# Patient Record
Sex: Female | Born: 1945 | Race: White | Hispanic: No | Marital: Married
Health system: Southern US, Community
[De-identification: ages and names within clinical notes are randomized; demographics above are authoritative.]

---

## 1997-11-27 ENCOUNTER — Other Ambulatory Visit: Admission: RE | Admit: 1997-11-27 | Discharge: 1997-11-27 | Payer: Self-pay | Admitting: Gynecology

## 1998-09-15 ENCOUNTER — Encounter: Admission: RE | Admit: 1998-09-15 | Discharge: 1998-10-22 | Payer: Self-pay | Admitting: Neurosurgery

## 1998-09-18 ENCOUNTER — Ambulatory Visit (HOSPITAL_COMMUNITY): Admission: RE | Admit: 1998-09-18 | Discharge: 1998-09-18 | Payer: Self-pay | Admitting: Neurosurgery

## 1998-09-18 ENCOUNTER — Encounter: Payer: Self-pay | Admitting: Neurosurgery

## 1998-10-02 ENCOUNTER — Ambulatory Visit (HOSPITAL_COMMUNITY): Admission: RE | Admit: 1998-10-02 | Discharge: 1998-10-02 | Payer: Self-pay | Admitting: Neurosurgery

## 1998-10-02 ENCOUNTER — Encounter: Payer: Self-pay | Admitting: Neurosurgery

## 1998-10-15 ENCOUNTER — Encounter: Payer: Self-pay | Admitting: Neurosurgery

## 1998-10-15 ENCOUNTER — Ambulatory Visit (HOSPITAL_COMMUNITY): Admission: RE | Admit: 1998-10-15 | Discharge: 1998-10-15 | Payer: Self-pay | Admitting: Neurosurgery

## 1998-12-19 ENCOUNTER — Other Ambulatory Visit: Admission: RE | Admit: 1998-12-19 | Discharge: 1998-12-19 | Payer: Self-pay | Admitting: Gynecology

## 1999-07-31 ENCOUNTER — Ambulatory Visit (HOSPITAL_BASED_OUTPATIENT_CLINIC_OR_DEPARTMENT_OTHER): Admission: RE | Admit: 1999-07-31 | Discharge: 1999-07-31 | Payer: Self-pay | Admitting: Orthopedic Surgery

## 2000-01-08 ENCOUNTER — Other Ambulatory Visit: Admission: RE | Admit: 2000-01-08 | Discharge: 2000-01-08 | Payer: Self-pay | Admitting: Gynecology

## 2000-01-29 ENCOUNTER — Encounter: Admission: RE | Admit: 2000-01-29 | Discharge: 2000-01-29 | Payer: Self-pay | Admitting: Gynecology

## 2000-01-29 ENCOUNTER — Encounter: Payer: Self-pay | Admitting: Gynecology

## 2000-08-26 ENCOUNTER — Inpatient Hospital Stay (HOSPITAL_COMMUNITY): Admission: RE | Admit: 2000-08-26 | Discharge: 2000-08-28 | Payer: Self-pay | Admitting: Gynecology

## 2001-01-30 ENCOUNTER — Encounter: Payer: Self-pay | Admitting: Gynecology

## 2001-01-30 ENCOUNTER — Encounter: Admission: RE | Admit: 2001-01-30 | Discharge: 2001-01-30 | Payer: Self-pay | Admitting: Gynecology

## 2001-04-25 ENCOUNTER — Encounter: Payer: Self-pay | Admitting: Emergency Medicine

## 2001-04-25 ENCOUNTER — Encounter: Admission: RE | Admit: 2001-04-25 | Discharge: 2001-04-25 | Payer: Self-pay | Admitting: Emergency Medicine

## 2001-05-12 ENCOUNTER — Encounter: Admission: RE | Admit: 2001-05-12 | Discharge: 2001-05-12 | Payer: Self-pay | Admitting: Emergency Medicine

## 2001-05-12 ENCOUNTER — Encounter: Payer: Self-pay | Admitting: Emergency Medicine

## 2001-10-02 ENCOUNTER — Other Ambulatory Visit: Admission: RE | Admit: 2001-10-02 | Discharge: 2001-10-02 | Payer: Self-pay | Admitting: Gynecology

## 2002-02-02 ENCOUNTER — Encounter: Payer: Self-pay | Admitting: Gynecology

## 2002-02-02 ENCOUNTER — Encounter: Admission: RE | Admit: 2002-02-02 | Discharge: 2002-02-02 | Payer: Self-pay | Admitting: Gynecology

## 2002-10-04 ENCOUNTER — Other Ambulatory Visit: Admission: RE | Admit: 2002-10-04 | Discharge: 2002-10-04 | Payer: Self-pay | Admitting: Gynecology

## 2003-02-04 ENCOUNTER — Encounter: Payer: Self-pay | Admitting: Gynecology

## 2003-02-04 ENCOUNTER — Encounter: Admission: RE | Admit: 2003-02-04 | Discharge: 2003-02-04 | Payer: Self-pay | Admitting: Gynecology

## 2003-08-18 ENCOUNTER — Encounter: Admission: RE | Admit: 2003-08-18 | Discharge: 2003-08-18 | Payer: Self-pay | Admitting: Neurosurgery

## 2003-09-25 ENCOUNTER — Inpatient Hospital Stay (HOSPITAL_COMMUNITY): Admission: RE | Admit: 2003-09-25 | Discharge: 2003-09-27 | Payer: Self-pay | Admitting: Neurosurgery

## 2003-10-17 ENCOUNTER — Other Ambulatory Visit: Admission: RE | Admit: 2003-10-17 | Discharge: 2003-10-17 | Payer: Self-pay | Admitting: Gynecology

## 2004-02-05 ENCOUNTER — Encounter: Admission: RE | Admit: 2004-02-05 | Discharge: 2004-02-05 | Payer: Self-pay | Admitting: Gynecology

## 2004-10-20 ENCOUNTER — Other Ambulatory Visit: Admission: RE | Admit: 2004-10-20 | Discharge: 2004-10-20 | Payer: Self-pay | Admitting: Gynecology

## 2004-11-16 ENCOUNTER — Observation Stay (HOSPITAL_COMMUNITY): Admission: RE | Admit: 2004-11-16 | Discharge: 2004-11-17 | Payer: Self-pay | Admitting: Gynecology

## 2005-02-23 ENCOUNTER — Encounter: Admission: RE | Admit: 2005-02-23 | Discharge: 2005-02-23 | Payer: Self-pay | Admitting: Gynecology

## 2005-04-09 ENCOUNTER — Ambulatory Visit (HOSPITAL_COMMUNITY): Admission: RE | Admit: 2005-04-09 | Discharge: 2005-04-09 | Payer: Self-pay | Admitting: *Deleted

## 2005-04-09 ENCOUNTER — Encounter (INDEPENDENT_AMBULATORY_CARE_PROVIDER_SITE_OTHER): Payer: Self-pay | Admitting: *Deleted

## 2005-11-03 ENCOUNTER — Other Ambulatory Visit: Admission: RE | Admit: 2005-11-03 | Discharge: 2005-11-03 | Payer: Self-pay | Admitting: Gynecology

## 2006-02-07 ENCOUNTER — Encounter (INDEPENDENT_AMBULATORY_CARE_PROVIDER_SITE_OTHER): Payer: Self-pay | Admitting: Specialist

## 2006-02-07 ENCOUNTER — Inpatient Hospital Stay (HOSPITAL_COMMUNITY): Admission: RE | Admit: 2006-02-07 | Discharge: 2006-02-12 | Payer: Self-pay | Admitting: General Surgery

## 2006-03-24 ENCOUNTER — Encounter: Admission: RE | Admit: 2006-03-24 | Discharge: 2006-03-24 | Payer: Self-pay | Admitting: Gynecology

## 2006-11-17 ENCOUNTER — Other Ambulatory Visit: Admission: RE | Admit: 2006-11-17 | Discharge: 2006-11-17 | Payer: Self-pay | Admitting: Gynecology

## 2007-04-19 ENCOUNTER — Encounter: Admission: RE | Admit: 2007-04-19 | Discharge: 2007-04-19 | Payer: Self-pay | Admitting: Gynecology

## 2008-11-04 ENCOUNTER — Encounter: Admission: RE | Admit: 2008-11-04 | Discharge: 2008-11-04 | Payer: Self-pay | Admitting: Gynecology

## 2010-08-18 ENCOUNTER — Other Ambulatory Visit: Payer: Self-pay | Admitting: Internal Medicine

## 2010-08-18 DIAGNOSIS — Z1231 Encounter for screening mammogram for malignant neoplasm of breast: Secondary | ICD-10-CM

## 2010-09-04 ENCOUNTER — Ambulatory Visit
Admission: RE | Admit: 2010-09-04 | Discharge: 2010-09-04 | Disposition: A | Discharge status: Home / Self Care | Payer: Medicare Other | Source: Ambulatory Visit | Attending: Internal Medicine | Admitting: Internal Medicine

## 2010-09-04 DIAGNOSIS — Z1231 Encounter for screening mammogram for malignant neoplasm of breast: Secondary | ICD-10-CM

## 2010-10-05 ENCOUNTER — Other Ambulatory Visit: Payer: Self-pay | Admitting: Gynecology

## 2010-10-30 NOTE — Discharge Summary (Signed)
[facility]  Patient:    Madeline Contreras, Madeline Contreras                        MRN: [id number] Adm. Date:  08/26/00 Disc. Date: 08/28/00 Attending:  Gretta Cool, M.D. CC:         Juluis Mire, M.D.   Discharge Summary  HISTORY OF PRESENT ILLNESS:  Ms. Hillesheim is a 65 year old white married female, gravida 2, para 2, who is admitted for definitive therapy for severe pelvic organ prolapse with incontinence of urine.  She has basically genuine stress incontinence and denies significant urgency.  Upon examination, it was noted that she has a large grade 3 to 4 cystocele even in the supine position with a bulge of the cystocele through the introitus.  There is marked paravaginal weakness and bilateral paravaginal defects, greater on the right than the left.  She also has apical support weakness with uterine prolapse, grade 2. There is also an inner sealing rectocele.  She reports incontinence of gas and liquid stool.  On physical examination, there is evidence of neurologic injury, possibly due to obstetric in origin to the perineum with loss of perirectal reflexes and diminished anal sphincter tone.  Bladder studies were done.  Evidence of 750 cc bladder capacity.  She had 11 recorded voidings in 24 hours.  There is a 30 cc residual urine volume.  The positive Q-tip test with 90 degree rotation of the vesicle neck.  Incontinence is demonstrated in the supine position, corrected with elevation of the vesicle neck.  She is now admitted for total abdominal hysterectomy, paravaginal plus Burch repairs, posterior enterocele repairs, and uterosacral cardinal colpos suspension under general anesthesia.  PHYSICAL EXAMINATION:  CHEST:  Clear to A&P.  HEART:  Rate and rhythm are regular without murmur, gallop, or cardiac enlargement.  ABDOMEN:  Soft and scaphoid without masses or organomegaly.  PELVIC:  External genitalia within normal limits for female.  Vagina  clean and rugose.  She has uterine descensus grade 2 with cystocele created by bilateral paravaginal defects bulging through the introitus.  There is a positive Q-tip test.  She has rectocele and enterocele that is symptomatic with detachment of the peritoneal body.  Uterus is normal size, shape, and contour.  Adnexa bilaterally clear.  Rectovaginal exam confirmed.  IMPRESSION: 1. Severe pelvic organ prolapse with grade 4 cystocele, grade 2 uterine    descensus, and stress urinary incontinence. 2. History of disk disease with surgery in 1986 by Dr. Tanya Nones. [place].  PLAN:  Total abdominal hysterectomy, bilateral salpingo-oophorectomy, paravaginal plus Burch, posterior enterocele repair, uterosacral cardinal colpos suspension under general anesthesia.  Risks and benefits had been discussed with the patient and she accepts these procedures.  LABORATORY DATA:  Admission hemoglobin 14.3, hematocrit 41.7.  On the first first postoperative day, hemoglobin was 11.2, hematocrit 32.2.  EKG normal sinus rhythm with a short PR interval.  Otherwise normal EKG.  HOSPITAL COURSE:  The patient underwent the above-named procedures without complications.  The patient was returned to the recovery room in excellent condition.  Pathology report revealed unremarkable cervix with no dysplasia identified, proliferative endometrium, and endometrial polyp with simple hyperplasia without atypia.  No atypical features or carcinoma identified, adenomyosis, right and left ovaries unremarkable, and right and left fallopian tubes unremarkable.  Her postoperative course was without complications and she was discharged on the second postoperative day in excellent condition.  DISCHARGE INSTRUCTIONS:  No heavy lifting or straining.  No vaginal entry. Increase ambulation as tolerated.  She is to call for any fever over a 100.5 or failure of daily improvement.  Diet regular.  Suprapubic catheter was removed prior  to discharge.  She is to return to the office in one week for a follow-up.  DISCHARGE MEDICATIONS: 1. Tylox 1 p.o. q.4-6h. p.r.n. 2. Vioxx 1 p.o. 25 mg q.d.  CONDITION ON DISCHARGE:  Excellent.  DISCHARGE DIAGNOSES: 1. Severe pelvic organ prolapse with cystocele, rectocele, and enterocele. 2. Severe stress urinary incontinence. 3. 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  PROCEDURES: 1. Total abdominal hysterectomy. 2. Bilateral salpingo-oophorectomy. 3. Paravaginal plus Burch procedure. 4. Posterior enterocele repairs. 5. Cardinal uterosacral suspension. 6. Colpos suspension under general anesthesia. DD:  09/05/00 TD:  09/05/00 Job: [id number]

## 2010-10-30 NOTE — H&P (Signed)
Madeline Contreras, Madeline Contreras                         ACCOUNT NO.:  [id number]   MEDICAL RECORD NO.:  [id number]                   PATIENT TYPE:  INP   LOCATION:  2899                                 FACILITY:  [facility]   PHYSICIAN:  Hilda Lias, M.D.                DATE OF BIRTH:  May 16, 1946   DATE OF ADMISSION:  09/25/2003  DATE OF DISCHARGE:                                HISTORY & PHYSICAL   HISTORY:  [name] is a lady who was seen by me more than a year ago  because of neck pain. About 16 years ago, this lady underwent a  C6-7 diskectomy. When she came to see me, she had been complaining of neck  pain with some pain going into the right upper extremity.  The patient had  conservative treatment. 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   The patient came back a year later, this time in February 2005 complaining  of neck pain again with radiation to the right upper extremity, with a  tingling sensation in the right hand with episodes of having weakness of the  right hand, dropping things in it.  The pain is intense, she is quite  miserable.  She has difficulty falling asleep.  She had conservative  treatment and this time, she did not get any better. Because of that, an MRI  was obtained and in view of the findings, she wanted to proceed with  surgery.   PAST SURGICAL HISTORY:  Cervical diskectomy through a posterior approach  about 16 years ago. Also, she has history of back surgery by someone else.  Surgery on the left shoulder 2 times, as well as hysterectomy and bladder  surgery.   FAMILY HISTORY:  Unremarkable.   SOCIAL HISTORY:  She smokes, does not drink.   REVIEW OF SYSTEMS:  Positive for leg pain.   PHYSICAL EXAMINATION:  HEENT: Normal.  NECK:  She is able to flex but extension and rotation produces discomfort,  mostly to the right shoulder.  LUNGS:  Clear.  CARDIOVASCULAR:  Sounds normal.  ABDOMEN:  Normal.  EXTREMITIES:  Normal pulses.  NEUROLOGIC:  She has weakness of both wrist extensors, weakness of the right  triceps with weakness of the hypothenar muscles.  Deltoids are normal.  Reflexes show decreased triceps with absence of biceps.   STUDIES:  Cervical spine films showed cervical spondylosis at the level of  5-6, 6-7 and 7-1.  MRI showed spondylosis at the level of 6-7, 6-7 and 7-1.  Initially, the C7-1 was read as negative by the radiologist, but after we  asked Dr. Barrington Ellison to take a look, he found that indeed, patient had cervical  spondylosis at the level of cervical 7 with thoracic 1.  EMG, nerve  conduction tests showed indeed she has spondylosis which involves the 6, 7  and 8th nerve roots.   CLINICAL IMPRESSION:  Cervical spondylosis 5-6, 6-7, 7-thoracic 1.   RECOMMENDATIONS:  The patient was admitted for surgery. The procedure will  be 3 level anterior cervical diskectomy.  Surgery was explained to her and  her husband, including risks of damage to the vertebral artery with bleeding  and stroke, damage to the carotid artery, damage to the esophagus and  trachea, failure of the bone graft, need for surgery, and because we are  going to be working close to the apex of the lung, the possibility of damage  to the lung, as well as damage of the nerve going to the vocal cord.  The  patient declined another opinion.                                                Hilda Lias, M.D.    EB/MEDQ  D:  09/25/2003  T:  09/25/2003  Job:  [id number]

## 2010-10-30 NOTE — H&P (Signed)
Madeline Contreras, Madeline Contreras               ACCOUNT NO.:  [id number]   MEDICAL RECORD NO.:  [id number]          PATIENT TYPE:  INP   LOCATION:  1617                         FACILITY:  [facility]   PHYSICIAN:  Anselm Pancoast. Zachery Dakins, M.D.DATE OF BIRTH:  1946/05/14   DATE OF ADMISSION:  02/07/2006  DATE OF DISCHARGE:  02/12/2006                                HISTORY & PHYSICAL   CHIEF COMPLAINT:  Tubovillous adenoma of cecum.   HISTORY:  [name] is a 64 year old Caucasian female who I saw in  the office in July accompanied by her husband after she was referred to me  by Dr. Bosie Clos, who had done a recent repeat colonoscopy and found a  tubovillous adenoma still arising in the cecum, and she had had a previous  colonoscopy by Dr. Luther Parody approximately a year and a half earlier with a  similar lesion noted.  At that time, however, there was no high-grade  granular dysplasia or no dysplasia noted on the biopsies, and on repeat  biopsies, Dr. Bosie Clos thought that this would be best managed with a right  colectomy and referred to me for the planned surgery.  She is otherwise in  good health.  She has had disk surgery of her neck and back.  She sees Dr.  Esperanza Richters for her chronic medical care and has had a hysterectomy, also  a bladder and a rectocele repair.  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  There is no  history of colon cancer.   Last chest x-ray was done in the spring.  She has had breast mammograms.  She sees Dr. Creola Corn as her primary care, in addition to Dr. Nicholas Lose.   ALLERGIES:  DENIES ANY ALLERGIES.   MEDICATIONS:  She is on an estrogen patch, 0.025 mg, one per week and  esterase cream twice a week.   PHYSICAL EXAMINATION:  VITAL SIGNS:  She is 5 feet 2, weighs 143 pounds,  blood pressure 145/80, pulse 90.  Temperature is normal.  Well hydrated.  There is no cervical or supraclavicular lymphadenopathy.  LUNGS:  Clear.  ABDOMEN:  She has a soft flat abdomen, no organomegaly.  I did not do a  repeat rectal exam, since she days she had a recent colonoscopy.  She has  had a Erythromycin, Neomycin following a GoLYTELY bowel prep in preparation  for the right colectomy.   ADMISSION/IMPRESSION:  Persistent tubovillous adenoma of cecum.   PLAN:  A right colectomy.           ______________________________  Anselm Pancoast. Zachery Dakins, M.D.     WJW/MEDQ  D:  03/09/2006  T:  03/10/2006  Job:  [id number]

## 2010-10-30 NOTE — Op Note (Signed)
[facility]  Patient:    Madeline Contreras, Madeline Contreras                        MRN: [id number] Proc. Date: 07/31/99 Adm. Date:  [id number] Attending:  Milly Jakob CC:         Harvie Junior, M.D.             Patients chart                           Operative Report  PREOPERATIVE DIAGNOSIS:  Stiff glenohumeral joint with subacromial impingement nd partial thickness rotator cuff tear.  POSTOPERATIVE DIAGNOSIS:  Stiff glenohumeral joint with subacromial impingement and partial thickness rotator cuff tear.  OPERATIONS: 1. Manipulation of shoulder under general anesthesia. 2. Arthroscopic violation of joint with debridement of undersurface partial    thickness tear in the glenohumeral joint. 3. Anterolateral acromioplasty. 4. Distal clavicle excision.  SURGEON:  Harvie Junior, M.D.  ASSISTANT:  Kerby Less, P.A.  ANESTHESIA:  General.  INDICATION:  She is a 65 year old female with a long history of having had an injury to her shoulder at work.  She ultimately spiraled to develop a stiff shoulder.  She was in therapy for a prolonged period of time and regained some f her function but not enough to be satisfactory.  Because of continued complaints of pain and limited mobility, the patient was taken to the operating room for evaluation under anesthesia arthroscopy.  DESCRIPTION OF PROCEDURE:  The patient is taken to the operating room, and after adequate anesthesia was obtained with general anesthetic, the patient was placed supine on the operating table.  The left arm was then evaluated under anesthesia and noted to have a limitation of 120 degrees of forward flexion, internal rotation block at about L5, and an external rotation block at about the 15 degree.  Gentle manipulation was undertaken and no significant change in motion was encountered.  Slightly more forceful manipulation was able to free scar tissue o allow overhead elevation to  180 degrees, external rotation to 60 degrees with the arm at the side, 90 degrees with the arm in 90 degrees of abduction, and internal rotation to T4.  Following this, the shoulder was prepped and draped in the usual sterile fashion. Routine arthroscopic examination of the glenohumeral joint revealed there was some supraspinatus tearing.  This was debrided with a suction-shaver, certainly with a high grade partial thickness tear.  Following this, the labrum and biceps tendon were identified and noted to be normal.  No significant glenohumeral joint arthritis.  No significant articular  wear.  The camera was then removed from the glenohumeral joint and up into the subacromial space.  Significant tenacious bursa was encountered.  This was debrided with a suction-shaver.  The anterolateral corner of the acromion was then removed with a bur.  The distal clavicle was identified and then 15 mm was removed anterior to  posterior through an anterior portal.  The bursa was debrided from the superior surface and the rotator cuff was identified from superiorly and there was no full-thickness tear.  You could palpate with a probe where there was some partial tear from the undersurface but they did not appear to go full thickness in any area.  At this point, given that she had had the manipulation and the stiff shoulder as a primary process, it was felt that undertaking rotator cuff  tear was not appropriate at this time.  At this point, the shoulder was copiously irrigated and suctioned dry.  The arthroscopic portals were then closed with Steri-Strips.  A sterile compressive  dressing was applied and the patient was taken to the recovery room where she was noted to be in satisfactory condition.  ESTIMATED BLOOD LOSS:  None. DD:  07/31/99 TD:  08/01/99 Job: [id number]

## 2010-10-30 NOTE — Op Note (Signed)
Madeline Contreras, Madeline Contreras                         ACCOUNT NO.:  [id number]   MEDICAL RECORD NO.:  [id number]                   PATIENT TYPE:  INP   LOCATION:  3172                                 FACILITY:  [facility]   PHYSICIAN:  Hilda Lias, M.D.                DATE OF BIRTH:  1945/10/02   DATE OF PROCEDURE:  09/25/2003  DATE OF DISCHARGE:                                 OPERATIVE REPORT   PREOPERATIVE DIAGNOSIS:  C5-C6, C6-C7, C7-T1 herniated disc, spondylosis  with chronic radiculopathy.   POSTOPERATIVE DIAGNOSIS:  C5-C6, C6-C7, C7-T1 herniated disc, spondylosis  with chronic radiculopathy.   PROCEDURE:  Decompression of the spinal cord at C5-C6, C6-C7, and C7-T1,  with bilateral foraminotomy, interbody fusion with allograft placed from C5  to T1.  Microscope.   SURGEON:  Hilda Lias, M.D.   ASSISTANT:   CLINICAL HISTORY:  The patient was admitted because of neck pain with  radiation to the right upper extremity associated with weakness.  I have  followed this lady for many years.  In the past, she had a posterior C6-C7  discectomy.  Now, she has severe spondylosis at C5-C6, C6-C7, and C7-T1.  The patient has failed conservative treatment and surgery was advised.  The  risks were explained during the history and physical.   PROCEDURE:  The patient was taken to the OR and she was intubated.  She had  a short neck and we had to use a small roll between the shoulder blades to  extend slightly the spine.  Having done this, the area was prepped with  Betadine.  A longitudinal incision was made through the skin and platysma  down to the cervical spine.  We found a large osteophyte which we knew was  at the C5-C6 and C6-C7.  A needle was inserted and was at the C5-C6.  The  anterior ligament was opened at C5-C6 and C6-C7 and C7-T1.  Indeed, the  patient had quite a bit of degenerative disc disease between 5-6.  Using the  drill as well as the microcuret, removal of the disc was  accomplished.  The  patient has a posterior ligament which was somewhat calcified.  An incision  transversely was done with removal of osteophytes in the midline and  laterally.  Decompression was accomplished.  At the level of C7-T1, we found  the same findings with quite a bit of spondylosis, worse on the right side.  At the level of C7-T1, we found mostly herniated disc with some spondylosis  on the left side.  From then on, having good decompression of the spinal  cord, the endplates were drilled.  Three pieces of allograft was inserted.  This was followed by a plate from C5 down to T1.  Lateral C-spine showed  good position of the bone graft.  The area was irrigated.  Nevertheless,  because we had to do quite a dissection, we left  a Jackson-Pratt drain in  the space.  Having good decompression and good hemostasis, the area was  irrigated.  The wound was closed with Vicryl and Steri-Strips.                                               Hilda Lias, M.D.    EB/MEDQ  D:  09/25/2003  T:  09/25/2003  Job:  [id number]

## 2010-10-30 NOTE — Op Note (Signed)
[facility]  Patient:    Madeline Contreras, Madeline Contreras                        MRN: [id number] Proc. Date: 08/26/00 Attending:  Gretta Cool, M.D. CC:         Juluis Mire, M.D.   Operative Report  PREOPERATIVE DIAGNOSES: 1. Severe pelvic organ prolapse with cystocele, rectocele, enterocele. 2. Severe stress urinary incontinence. 3. 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  POSTOPERATIVE DIAGNOSES: 1. Severe pelvic organ prolapse with cystocele, rectocele, enterocele. 2. Severe stress urinary incontinence. 3. 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  PROCEDURES:  Total abdominal hysterectomy, bilateral salpingo-oophorectomy, paravaginal plus Burch procedure, posterior and enterocele repairs, cardinal uterosacral suspension, colposuspension.  SURGEON:  Gretta Cool, M.D.  ASSISTANT:  Raynald Kemp, M.D.  ANESTHESIA:  General endotracheal.  DESCRIPTION OF PROCEDURE:  Under excellent general anesthesia as above, with the patients abdomen prepped and draped in Allen stirrups in modified lithotomy position, with her Foley catheter draining her bladder, a Pfannenstiel incision was made and extended through the fascia. The rectus muscles were then separated in the midline and the peritoneum opened. The abdomen was then explored. There were no abnormalities identified in the upper abdomen. The appendix was sclerotic in appearance and not removed. The cecum appeared quite redundant and descended into the pelvis. The uterus, ovaries, and tubes were exceedingly mobile but otherwise without abnormality. Both ovaries were quite sclerotic and small. The adnexal structures were then elevated by placing Kelly clamps across the tubes, round ligament, and parametrial structures. The round ligament was then transected on each side using the cautery. The anterior leaf of  the broad ligament was then opened, and the bladder pushed off the lower uterine segment. The infundibulopelvic vessels were then isolated from the ureter clamped, cut, sutured, and tied with 0 Vicryl. The uterine vessels were then skeletonized, clamped, cut, sutured, and tied with 0 Vicryl. The cardinal ligaments, then uterosacral ligaments were then clamped, cut, sutured, and tied with 0 Vicryl. The cervix was then excised, and the vagina closed with a running suture of 0 Vicryl. At this point, sutures of 0 Ethibond were placed in the uterosacral ligament approximately 4 cm from its insertion to the vaginal cuff. The ligament was sutured in the cul-de-sac and then across the uterosacral insertion into the vaginal cuff posteriorly then to the cardinal ligament then to anterior vaginal wall fascia. Each suture was placed on each side and then the sutures tied. A Halban culdoplasty was then performed to close the remaining enterocele sac. The pelvis was then irrigated with lactated Ringer. There was no significant bleeding. The pelvic peritoneum was then closed with a running suture of #2-0 Monocryl from the left of the cuff to the right infundibulopelvic so as to cover the peritoneal defect. The defect was covered without tension. Pelvis was then irrigated once again, and the packs and retractors removed. The abdominal peritoneum was then closed with running suture of 0 Monocryl. At this point, attention was turned to the paravaginal and Burch retropubic suspension procedure.  The retropubic space was dissected and the adventitial structure and fatty tissue removed from the endopelvic and paracervical fascia. The landmarks were identified, including the white line fascia of the pelvis, the pubis symphysis, and the ischial spine. The defects were readily available with evidence of condensation of  fascia at the previous area of separation and cystocele formation. With the operators finger  placed in the vagina, the suture placement was guided. With sutures placed from across the separated paracervical fascia was secured from the apex of the vaginal cuff to within a centimeter of the ischial spine. Sutures were then placed at approximately 1 cm intervals until the entire paravaginal fascial defect was repaired to underneath the symphysis. The Burch procedures were then placed so as to elevate the vesicle neck into a high intra-abdominal position. At this point, all the sutures site were dry. The retropubic space was irrigated once again, and repair of the anterior abdominal wall undertaken. The rectus muscles were plicated in the midline with a running suture of 0 Monocryl. Fascia was approximated with running suture of 0 Vicryl from each angle to the midline. Subcutaneous tissues approximated with interrupted sutures of 3-0 Vicryl, and the skin closed with skin staples and Steri-Strips. At the end of the procedure, the sponge and lap counts are correct. There are no complications. The patient returned to the recovery room in excellent condition.  At this point, the patient was repositioned for the vaginal portion of the procedure. The posterior vaginal wall was grasped with the introitus by vaginal clamps, Allis clamps. The mucosa was then incised and undermined to the apex of the vaginal cuff. The mucosa was then dissected by blunt and sharp dissection from the perirectal fascia. The enterocele weakness was again identified vaginally, and the fascia secured from approximately mid vagina the fascia was pulled all the way to the vaginal cuff closure and secured to the vaginal cuff with a running suture of 0 Vicryl. The lower levels of endopelvic fascia were then plicated in the midline from the apex of the vaginal cuff to the introitus. The perineal body muscles were then reapproximated with interrupted sutures of 2-0 Vicryl. The enterocele and rectocele were found to be well  reduced by rectal exam. The mucosa was ______ and closed with a  running subcuticular closure of 2-0 Vicryl. The upper layers of endopelvic fascia were approximated along with the mucosa. At this point, the vagina remained adequate by digital. There was no evidence of weakness of fascia in any area. At this point, the bladder was filled and the Bonnano suprapubic Cystocath was placed and secured with 0 Ethibond. At the end of the procedure, sponge and lap counts were correct. No complications. The patient returned to the recovery room in excellent condition. DD:  08/26/00 TD:  08/26/00 Job: [id number]

## 2010-10-30 NOTE — Discharge Summary (Signed)
Madeline Contreras, TABOR               ACCOUNT NO.:  [id number]   MEDICAL RECORD NO.:  [id number]          PATIENT TYPE:  INP   LOCATION:  1617                         FACILITY:  [facility]   PHYSICIAN:  Anselm Pancoast. Zachery Dakins, M.D.DATE OF BIRTH:  1946/05/29   DATE OF ADMISSION:  02/07/2006  DATE OF DISCHARGE:  02/12/2006                                 DISCHARGE SUMMARY   DISCHARGING DIAGNOSES:  1. 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  2. Chronic cholecystitis with stones.   OPERATION:  Right colectomy and cholecystectomy.   HISTORY:  Ms. Kraszewski is a 65 year old female who comes in accompanied by her  husband, referred by Dr. Bosie Clos who did a recent colonoscopy on her and  found a tubovillous adenoma still in the cecum.  She had had a previous  colonoscopy and biopsy by Dr. Luther Parody approximately a year and a half  earlier and lesions were noted, but was not able to get completely excised.  Dr. Bosie Clos recommended that this be surgically removed, as there was high-  grade granular dysplasia in the present biopsies that had changed from the  previous.  The patient is otherwise in good health.  She has had orthopedic  and disk surgery.  Dr. Nicholas Lose has done a hysterectomy and she is presently on  estrogen replacement.  Chest x-ray had been unremarkable, and I recommended  that we proceed on with an elective cholecystectomy and an elective right  colectomy.  She did not give any history of symptoms consistent with  gallbladder symptoms, but at the time of surgery she was found to have  numerous stones within her gallbladder, not a lot of adhesions around it.  I  talked with the patient's husband in the waiting room and recommended we go  ahead and proceed with the cholecystectomy simultaneously.  He was in  agreement.  She did nicely.  We did not place a nasogastric tube, and it was  about 2 days before she started having any flatus.  We at that time started  on a liquid diet and  then advanced up to a full liquid and now a select  diet, and she has done nicely.   The lab studies preoperatively were unremarkable with a hematocrit of 41, a  white count of 4.1.  A postoperative hematocrit was 36.  Her liver function  studies were normal preoperatively and postoperatively.  The pathology  report showed high-grade granular dysplasia and a tubovillous adenoma, no  frank cancer, and 24 lymph nodes were excised.  The gallbladder shows  chronic cholecystitis and stones.  The patient is now about 5 days after  surgery.  Her incisions were healing nicely, and I have removed most of the  staples and place Steri-Strips, and she is having good bowel function.  She  may have a little bit of looser bowel movements than she had preoperatively,  but this should  correct itself, and she is discharged now with Vicodin for pain, and was  also continued on her estrogen replacements.  She does not work and  understands she should not be done heavy lifting  for approximately 3 weeks.  I will see her in the office for a wound check in approximately 1 week.           ______________________________  Anselm Pancoast. Zachery Dakins, M.D.     WJW/MEDQ  D:  02/12/2006  T:  02/14/2006  Job:  [id number]   cc:   Shirley Friar, MD  Fax: [phone]   Gretta Cool, M.D.  Fax: [phone]

## 2010-10-30 NOTE — H&P (Signed)
[facility]  Patient:    Madeline Contreras, Madeline Contreras                        MRN: [id number] Adm. Date:  08/26/00 Attending:  Gretta Cool, M.D. CC:         Juluis Mire, M.D.   History and Physical  CHIEF COMPLAINT: 1. Pelvic organ prolapse. 2. Urinary incontinence.  HISTORY OF PRESENT ILLNESS:  A 65 year old, white, married, gravida 2, para 2 admitted for definitive therapy of severe pelvic organ prolapse with incontinence of urine. She has a basically genuine stress incontinence pattern and denies significant urgency component. She also has a large grade 3-4 cystocele even in supine position with bulge of the cystocele through the introitus. She has marked paravaginal weakness and bilateral paravaginal defects right greater than left. She also has apical support weakness with uterine prolapse, grade 2. She also has enterocele and rectocele. 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 She also has evidence of previous anal sphincter injury at delivery with diminished neurologic reflexes perianal and diminished sphincter tone.  She has had bladder studies with evidence of 750 cc bladder capacity. She has frequent voidings with 11 recorded in 24 hours. She has 30 cc residual urine volume. She has positive Q-tip test with 90 degree rotation of the vesical neck. She has incontinence demonstrated in supine position and corrected by elevation of vesical neck. She is now admitted for a total abdominal hysterectomy, paravaginal plus burch repairs, posterior and enterocele repairs and uterosacral cardinal colposuspension.  PAST MEDICAL HISTORY:  Usual childhood disease without sequelae. Medical illnesses none of consequence.  ACCIDENTS/INJURIES:  None.  PAST SURGICAL HISTORY:   Disk surgery in 1986 by Dr. Jeral Fruit.  ALLERGIES:  None known.  CURRENT MEDICATIONS:  Premarin 0.625 and prometrium 200 mg 1-12.  FAMILY HISTORY:  Mother and father both living and in reasonably good health. Father had coronary bypass procedure 20 years ago. Four siblings living and well.  REVIEW OF SYSTEMS:  HEENT:  Denies symptoms.  CARDIORESPIRATORY:  Denies asthma, cough, bronchitis or shortness of breath. GI/GU:  Denies frequency, urgency, dysuria, change in bowel habits, or food intolerance.  PHYSICAL EXAMINATION:  GENERAL:  Well-developed, well-nourished, thin, white female 126 pounds at 5 feet 1.5 inches.  HEENT:  Pupils equal and reactive to light and accommodate. Fundi not examined. Oropharynx clear.  NECK:  Supple without mass or thyroid enlargement.  CHEST:  Clear to auscultation and percussion.  BREASTS:  Soft without mass, nodes, nipple discharge.  HEART:  Regular rhythm without murmur or cardiac enlargement.  ABDOMEN:  Soft, scaphoid without mass or organomegaly.  PELVIC:  External genitalia normal female, vagina clean and rugose. She has uterine descensus grade 2 with cystocele created by bilateral paravaginal defects bulging through the introitus. She has a positive Q-tip test as above. She also has rectocele and enterocele that is symptomatic but detachment of perineal body. The uterus is normal size, shape, and contour. Adnexa clear. Rectovaginal confirms.  IMPRESSION: 1. Severe pelvic organ prolapse with grade 4 cystocele, grade 2 uterine    descensus, stress urinary incontinence. 2. History of disk disease.  RECOMMENDATIONS:  Total abdominal hysterectomy, bilateral salpingo-oophorectomy, paravaginal plus burch, posterior and enterocele repairs and uterosacral cardinal colposuspension.  I discussed the risk/benefit ratio, the alternative forms of  therapy including no therapy. The patient understands and accepts these risks. DD:  08/28/00 TD:   08/29/00 Job: [id number]

## 2010-10-30 NOTE — Op Note (Signed)
NAMESUNSHINE, Madeline Contreras               ACCOUNT NO.:  [id number]   MEDICAL RECORD NO.:  [id number]          PATIENT TYPE:  AMB   LOCATION:  DAY                          FACILITY:  [facility]   PHYSICIAN:  Gretta Cool, M.D. DATE OF BIRTH:  [birth year]   DATE OF PROCEDURE:  11/16/2004  DATE OF DISCHARGE:                                 OPERATIVE REPORT   PREOPERATIVE DIAGNOSIS:  Enterocele and rectocele, recurrent.   POSTOPERATIVE DIAGNOSIS:  Enterocele and rectocele, recurrent.   PROCEDURE:  Posterior and enterocele repair with colposuspension and repair  of perineal body.   SURGEON:  Gretta Cool, M.D.   ANESTHESIA:  General endotracheal.   DESCRIPTION OF PROCEDURE:  Under excellent general anesthesia as above with  the patient prepped and draped in lithotomy position, the vaginal introitus  was grasped posteriorly with Allis clamps and a V-shaped incision was made.  The mucosa was then excised and the vaginal mucosa undermined all the way to  the apex of the vaginal cuff. The endopelvic fascia and perirectal fascia  was then dissected from the mucosa. A very large enterocele was encountered  at the apex of the vagina and was entered during dissection. The enterocele  was then dissected free from the perirectal fascia. A large fat pad was  present and that fat pad was also dissected free from the enterocele sac.  The fat pad was excised. The enterocele sac was then also excised and the  peritoneum closed with a running suture of 0 Vicryl. Next, the cardinal  uterosacral ligament complex was identified and sutures of 2-0 Novofil  placed through the uterosacral cardinal complex and then secured to the  detached fascia that had descended to approximately the lower third of the  vagina. The fascia was then formed back into position with a mattress suture  of 2-0 Novofil. Both sides were so treated. The fascia was then plicated in  a pursestring suture from uterosacral complex to  midline fascia to anterior  vaginal wall and then tied tight so as to close a complete layer of  endopelvic fascia. The fascia was then approximated with interrupted sutures  of 0 Novofil so as to completely enclose the endopelvic fascia. There was no  evidence of fascial defect in any area. At this point the posterior  endopelvic fascia was plicated in the midline from the apex of the vagina to  the introitus. The mucosa was then trimmed and closed with a running suture  of 2-0 Vicryl. It was closed as a submucosal closure including the upper  layers of endopelvic fascia in the closure. At this point, the perineal body  muscles were reapproximated and the bulbous cavernosa muscle reapproximated  in the midline and the perineal body mucosa closed with a running suture of  2-0 Vicryl. At this point a Bonnano suprapubic Cystocath was placed and  secured with 0 Novofil. At this point the procedure was terminated without  complication. The patient returned to the recovery room in excellent  condition.      CWL/MEDQ  D:  11/16/2004  T:  11/16/2004  Job:  [id number] 

## 2010-10-30 NOTE — Op Note (Signed)
[name], [name]               ACCOUNT NO.:  [id number]   MEDICAL RECORD NO.:  [id number]          PATIENT TYPE:  INP   LOCATION:  0005                         FACILITY:  [facility]   PHYSICIAN:  Anselm Pancoast. Zachery Dakins, M.D.DATE OF BIRTH:  28-Jan-1946   DATE OF PROCEDURE:  02/07/2006  DATE OF DISCHARGE:                                 OPERATIVE REPORT   PREOPERATIVE DIAGNOSIS:  Tubulovillous adenoma of cecum.   POSTOPERATIVE DIAGNOSES:  1. Tubulovillous adenoma of cecum.  2. Gallstones.   OPERATION:  Open right colectomy and cholecystectomy.   ANESTHESIA:  General anesthesia.   SURGEON:  Anselm Pancoast. Zachery Dakins, M.D.   ASSISTANT:  Wilmon Arms. Tsuei, M.D.   HISTORY:  Madeline Contreras a 65 year old female who was referred to me by Dr.  Charlott Rakes.  She is a patient of Dr. Larae Grooms and was noted to have  a tubovillous adenoma in her colon that was attempt to be removed  laparoscopically approximately a year ago by Dr. Luther Parody.  She had a  repeat colonoscopy recently by Dr. Bosie Clos and he recommended that she  undergo a right colectomy, since the lesion is obviously still present; it  is kind of on the folds and impossible to excise with the laparoscopic.  The  patient saw me in the office approximately 3 weeks ago and we have elected  for the planned procedure today.  She had the GoLYTELY and Neomycin and  erythromycin bowel prep and laboratory studies preoperatively are all  normal.  The patient was taken to the operative suite, she was given 3 g of  Unasyn, she has PAS stockings and we discussed about a midline incision.  The patient was induced under general anesthesia.  The abdomen was prepped  with Betadine surgical solution.  A Foley catheter had been inserted  sterilely and then a small incision that kind of goes just above and below  the umbilicus was made with sharp dissection down through the adipose  tissue.  Notably, she only weighs 143 pounds, but she is quite short  and  surprisingly, there is enough adipose tissue for someone of this size.  The  fascia was identified and opened very carefully right above the umbilicus  and then I placed my finger within the peritoneal cavity and made the  incision probably about 4 inches in length.  The cecum was lying in its  normal position in the right lower quadrant.  She has still got her appendix  and on exploring the abdomen and on palpation of the liver, I could feel  that she had numerous gallstones that were not known preoperatively.  I  called and talked to her husband, who was in the waiting room about going to  go ahead and remove the gallbladder and he was in agreement, even though she  is asymptomatic from the gallbladder.  The incision, I extended it up about  an inch, and were still working through kind of a small incision.  I had  opened up the lateral peritoneal reflection of the cecum and ascending colon  and maybe you could feel a  little thickening in the cecal area, but it was  certainly not anything that felt like an obvious tumor.  With the permission  obtained from her husband, we sort of switched at this point and I was  working from the left side of the table and with Dr. Marcille Blanco retracting  on the upper abdomen, took the gallbladder down in a retrograde manner.  Hemostasis was carefully obtained with little clips and cautery on opening  up the peritoneum, etc, and then the proximal portion of the gallbladder was  very carefully freed up and I could identify the cystic artery that was  doubly clipped proximally, singly distally and divided, and then we had the  gallbladder completely freed.  There were numerous, fairly good-sized stones  in the gallbladder and then I encompass the cystic duct with a right angle,  removed the gallbladder with Tresa Endo on it and then tied the cystic duct with  a 2-0 silk and then put a clip just distal to the tie.  Later, we  reinspected the area and we could  see the clip and ties, no bleeding or bile  leakage.  After this, we then went ahead and freed up the hepatic flexure of  the gallbladder and could bring the area to skin level.  The mesentery was  divided between [facility] and I did the functional side-to-side anastomosis  with a GIA 55 and the TA-60 in the triangulation technique and good  hemostasis with both of the staple lines.  I then put a little reinforcement  stitch at the crotch with a 3-0 silk and then the mesentery was carefully  closed with interrupted sutures of 3-0 silk and we made care so that the  small bowel was not twisted.  It was lying sort of to the right of the  incision.  We then irrigated in the pelvis and also in the gallbladder fossa  with no evidence of any bleeding and the small bowel was lying in good  anatomical position.  Next, the omentum was placed under the incision and I  then closed the fascia with a running #1 Prolene, 2 sutures being used and  then the skin was closed with staples.  We put a couple of interrupted  sutures of the #1 Prolene where there was a little separation in the fascia  and in the epigastric area in a couple of locations.  Next, the skin was  closed with staples.  Dr. Luisa Hart examined the specimen grossly and  confirmed that there was a lesion that looks benign, so there was an adenoma-  type appearance according to Dr. Luisa Hart.  The patient tolerated procedure  nicely and was sent to the recovery room in a stable postop condition.   I am going to attempt to keep her n.p.o. and not use an NG tube, unless she  is nauseated.  We will be able to use PCA morphine for postoperative pain.           ______________________________  Anselm Pancoast. Zachery Dakins, M.D.     WJW/MEDQ  D:  02/07/2006  T:  02/08/2006  Job:  [id number]

## 2011-08-16 ENCOUNTER — Other Ambulatory Visit: Payer: Self-pay | Admitting: Internal Medicine

## 2011-08-16 DIAGNOSIS — Z1231 Encounter for screening mammogram for malignant neoplasm of breast: Secondary | ICD-10-CM

## 2011-09-07 ENCOUNTER — Ambulatory Visit
Admission: RE | Admit: 2011-09-07 | Discharge: 2011-09-07 | Disposition: A | Discharge status: Home / Self Care | Payer: Medicare Other | Source: Ambulatory Visit | Attending: Internal Medicine | Admitting: Internal Medicine

## 2011-09-07 DIAGNOSIS — Z1231 Encounter for screening mammogram for malignant neoplasm of breast: Secondary | ICD-10-CM

## 2012-09-11 ENCOUNTER — Other Ambulatory Visit: Payer: Self-pay

## 2012-09-11 DIAGNOSIS — Z1231 Encounter for screening mammogram for malignant neoplasm of breast: Secondary | ICD-10-CM

## 2012-10-12 ENCOUNTER — Ambulatory Visit
Admission: RE | Admit: 2012-10-12 | Discharge: 2012-10-12 | Disposition: A | Discharge status: Home / Self Care | Payer: Medicare Other | Source: Ambulatory Visit

## 2012-10-12 DIAGNOSIS — Z1231 Encounter for screening mammogram for malignant neoplasm of breast: Secondary | ICD-10-CM

## 2013-09-25 ENCOUNTER — Other Ambulatory Visit: Payer: Self-pay

## 2013-09-25 DIAGNOSIS — Z1231 Encounter for screening mammogram for malignant neoplasm of breast: Secondary | ICD-10-CM

## 2013-10-15 ENCOUNTER — Ambulatory Visit
Admission: RE | Admit: 2013-10-15 | Discharge: 2013-10-15 | Disposition: A | Discharge status: Home / Self Care | Payer: Medicare Other | Source: Ambulatory Visit

## 2013-10-15 ENCOUNTER — Encounter (INDEPENDENT_AMBULATORY_CARE_PROVIDER_SITE_OTHER): Payer: Self-pay

## 2013-10-15 DIAGNOSIS — Z1231 Encounter for screening mammogram for malignant neoplasm of breast: Secondary | ICD-10-CM

## 2014-10-01 ENCOUNTER — Other Ambulatory Visit: Payer: Self-pay

## 2014-10-01 DIAGNOSIS — Z1231 Encounter for screening mammogram for malignant neoplasm of breast: Secondary | ICD-10-CM

## 2014-10-21 ENCOUNTER — Ambulatory Visit
Admission: RE | Admit: 2014-10-21 | Discharge: 2014-10-21 | Disposition: A | Discharge status: Home / Self Care | Payer: Medicare Other | Source: Ambulatory Visit

## 2014-10-21 DIAGNOSIS — Z1231 Encounter for screening mammogram for malignant neoplasm of breast: Secondary | ICD-10-CM

## 2015-10-08 ENCOUNTER — Other Ambulatory Visit: Payer: Self-pay

## 2015-10-08 DIAGNOSIS — Z1231 Encounter for screening mammogram for malignant neoplasm of breast: Secondary | ICD-10-CM

## 2015-10-22 ENCOUNTER — Ambulatory Visit
Admission: RE | Admit: 2015-10-22 | Discharge: 2015-10-22 | Disposition: A | Discharge status: Home / Self Care | Payer: Medicare Other | Source: Ambulatory Visit

## 2015-10-22 DIAGNOSIS — Z1231 Encounter for screening mammogram for malignant neoplasm of breast: Secondary | ICD-10-CM

## 2016-09-23 ENCOUNTER — Other Ambulatory Visit: Payer: Self-pay | Admitting: Internal Medicine

## 2016-09-23 DIAGNOSIS — Z1231 Encounter for screening mammogram for malignant neoplasm of breast: Secondary | ICD-10-CM

## 2016-10-22 ENCOUNTER — Ambulatory Visit
Admission: RE | Admit: 2016-10-22 | Discharge: 2016-10-22 | Disposition: A | Discharge status: Home / Self Care | Payer: Medicare Other | Source: Ambulatory Visit | Attending: Internal Medicine | Admitting: Internal Medicine

## 2016-10-22 DIAGNOSIS — Z1231 Encounter for screening mammogram for malignant neoplasm of breast: Secondary | ICD-10-CM

## 2017-10-03 ENCOUNTER — Other Ambulatory Visit: Payer: Self-pay | Admitting: Internal Medicine

## 2017-10-03 DIAGNOSIS — Z139 Encounter for screening, unspecified: Secondary | ICD-10-CM

## 2017-10-24 ENCOUNTER — Ambulatory Visit
Admission: RE | Admit: 2017-10-24 | Discharge: 2017-10-24 | Disposition: A | Discharge status: Home / Self Care | Payer: Medicare Other | Source: Ambulatory Visit | Attending: Internal Medicine | Admitting: Internal Medicine

## 2017-10-24 DIAGNOSIS — Z139 Encounter for screening, unspecified: Secondary | ICD-10-CM

## 2018-10-19 ENCOUNTER — Other Ambulatory Visit: Payer: Self-pay | Admitting: Internal Medicine

## 2018-10-19 DIAGNOSIS — Z1231 Encounter for screening mammogram for malignant neoplasm of breast: Secondary | ICD-10-CM

## 2018-10-26 ENCOUNTER — Ambulatory Visit
Admission: RE | Admit: 2018-10-26 | Discharge: 2018-10-26 | Disposition: A | Discharge status: Home / Self Care | Payer: Medicare Other | Source: Ambulatory Visit | Attending: Internal Medicine | Admitting: Internal Medicine

## 2018-10-26 ENCOUNTER — Other Ambulatory Visit: Payer: Self-pay

## 2018-10-26 DIAGNOSIS — Z1231 Encounter for screening mammogram for malignant neoplasm of breast: Secondary | ICD-10-CM

## 2018-10-27 ENCOUNTER — Other Ambulatory Visit: Payer: Self-pay | Admitting: Internal Medicine

## 2018-10-27 DIAGNOSIS — R921 Mammographic calcification found on diagnostic imaging of breast: Secondary | ICD-10-CM

## 2018-11-02 ENCOUNTER — Other Ambulatory Visit: Payer: Self-pay

## 2018-11-02 ENCOUNTER — Ambulatory Visit
Admission: RE | Admit: 2018-11-02 | Discharge: 2018-11-02 | Disposition: A | Discharge status: Home / Self Care | Payer: Medicare Other | Source: Ambulatory Visit | Attending: Internal Medicine | Admitting: Internal Medicine

## 2018-11-02 DIAGNOSIS — R921 Mammographic calcification found on diagnostic imaging of breast: Secondary | ICD-10-CM

## 2019-08-09 ENCOUNTER — Encounter: Payer: Self-pay | Admitting: Podiatry

## 2019-08-09 ENCOUNTER — Ambulatory Visit: Payer: Medicare PPO | Admitting: Podiatry

## 2019-08-09 ENCOUNTER — Other Ambulatory Visit: Payer: Self-pay

## 2019-08-09 ENCOUNTER — Ambulatory Visit (INDEPENDENT_AMBULATORY_CARE_PROVIDER_SITE_OTHER): Payer: Medicare PPO

## 2019-08-09 ENCOUNTER — Other Ambulatory Visit: Payer: Self-pay | Admitting: Podiatry

## 2019-08-09 VITALS — BP 162/90 | HR 108 | Temp 97.6°F

## 2019-08-09 DIAGNOSIS — M722 Plantar fascial fibromatosis: Secondary | ICD-10-CM

## 2019-08-09 DIAGNOSIS — M79671 Pain in right foot: Secondary | ICD-10-CM | POA: Diagnosis not present

## 2019-08-09 DIAGNOSIS — M79672 Pain in left foot: Secondary | ICD-10-CM

## 2019-08-09 DIAGNOSIS — M778 Other enthesopathies, not elsewhere classified: Secondary | ICD-10-CM | POA: Diagnosis not present

## 2019-08-09 NOTE — Progress Notes (Signed)
Subjective:   Patient ID: Madeline Contreras, female   DOB: 74 y.o.   MRN: [id number]   HPI Patient presents stating she is had a lot of pain in the bottom of the left heel and is also had discomfort in the forefoot left over right of long-term nature.  States that it is gotten worse over the last few weeks and she does not remember specific injury and does not smoke likes to be active   Review of Systems  All other systems reviewed and are negative.       Objective:  Physical Exam Vitals and nursing note reviewed.  Constitutional:      Appearance: She is well-developed.  Pulmonary:     Effort: Pulmonary effort is normal.  Musculoskeletal:        General: Normal range of motion.  Skin:    General: Skin is warm.  Neurological:     Mental Status: She is alert.     Neurovascular status intact muscle strength found to be adequate range of motion within normal limits.  Patient is noted to have exquisite discomfort plantar aspect left heel at the insertional point tendon calcaneus with inflammation fluid buildup and is noted to have midtarsal discomfort dorsally left over right with inflammation noted and probable arthritis.  Patient has good digital perfusion well oriented x3     Assessment:  Acute plantar fasciitis left with dorsal extensor tendinitis left over right with probable arthritis     Plan:  H&P reviewed condition sterile prep done and injected the fascia left 3 mg Kenalog 5 mg Xylocaine at insertion applied fascial brace left and instructed on possible treatment for the midfoot left over right.  Reappoint to recheck in several weeks  X-rays indicate significant midfoot arthritis left over right with moderate plantar spur and no indication of stress fracture calcaneus bilateral

## 2019-08-09 NOTE — Patient Instructions (Signed)

## 2019-08-23 ENCOUNTER — Encounter: Payer: Self-pay | Admitting: Podiatry

## 2019-08-23 ENCOUNTER — Ambulatory Visit: Payer: Medicare PPO | Admitting: Podiatry

## 2019-08-23 ENCOUNTER — Other Ambulatory Visit: Payer: Self-pay

## 2019-08-23 VITALS — Temp 97.6°F

## 2019-08-23 DIAGNOSIS — M722 Plantar fascial fibromatosis: Secondary | ICD-10-CM

## 2019-08-23 DIAGNOSIS — M778 Other enthesopathies, not elsewhere classified: Secondary | ICD-10-CM | POA: Diagnosis not present

## 2019-08-23 NOTE — Progress Notes (Signed)
Subjective:   Patient ID: Madeline Contreras, female   DOB: 74 y.o.   MRN: [id number]   HPI Patient states improving with significant diminishment of discomfort was pain still noted only upon deep palpation   ROS      Objective:  Physical Exam  Neurovascular status intact with significant diminishment of discomfort plantar heel and also discomfort in the extensor tendon complex which appears to be improved bilateral with pain occasional F2     Assessment:  2 separate problems with 1 being fasciitis-like symptoms the other being extensor tendinitis     Plan:  Reviewed both conditions and begin diclofenac gel for the extensor complex and discussed physical therapy anti-inflammatories and stretching exercises along with shoe gear modification.  Reviewed both conditions separately patient will be seen back if either condition were to give her symptoms

## 2019-10-12 ENCOUNTER — Other Ambulatory Visit: Payer: Self-pay | Admitting: Internal Medicine

## 2019-10-12 DIAGNOSIS — Z1231 Encounter for screening mammogram for malignant neoplasm of breast: Secondary | ICD-10-CM

## 2019-10-19 DIAGNOSIS — M859 Disorder of bone density and structure, unspecified: Secondary | ICD-10-CM | POA: Diagnosis not present

## 2019-10-19 DIAGNOSIS — R739 Hyperglycemia, unspecified: Secondary | ICD-10-CM | POA: Diagnosis not present

## 2019-10-19 DIAGNOSIS — E7849 Other hyperlipidemia: Secondary | ICD-10-CM | POA: Diagnosis not present

## 2019-10-19 DIAGNOSIS — R946 Abnormal results of thyroid function studies: Secondary | ICD-10-CM | POA: Diagnosis not present

## 2019-10-19 DIAGNOSIS — Z0000 Encounter for general adult medical examination without abnormal findings: Secondary | ICD-10-CM | POA: Diagnosis not present

## 2019-10-26 DIAGNOSIS — R739 Hyperglycemia, unspecified: Secondary | ICD-10-CM | POA: Diagnosis not present

## 2019-10-26 DIAGNOSIS — E785 Hyperlipidemia, unspecified: Secondary | ICD-10-CM | POA: Diagnosis not present

## 2019-10-26 DIAGNOSIS — M1990 Unspecified osteoarthritis, unspecified site: Secondary | ICD-10-CM | POA: Diagnosis not present

## 2019-10-26 DIAGNOSIS — E669 Obesity, unspecified: Secondary | ICD-10-CM | POA: Diagnosis not present

## 2019-10-26 DIAGNOSIS — I10 Essential (primary) hypertension: Secondary | ICD-10-CM | POA: Diagnosis not present

## 2019-10-26 DIAGNOSIS — Z0000 Encounter for general adult medical examination without abnormal findings: Secondary | ICD-10-CM | POA: Diagnosis not present

## 2019-10-26 DIAGNOSIS — L719 Rosacea, unspecified: Secondary | ICD-10-CM | POA: Diagnosis not present

## 2019-10-26 DIAGNOSIS — R946 Abnormal results of thyroid function studies: Secondary | ICD-10-CM | POA: Diagnosis not present

## 2019-10-26 DIAGNOSIS — H269 Unspecified cataract: Secondary | ICD-10-CM | POA: Diagnosis not present

## 2019-10-26 DIAGNOSIS — Z1331 Encounter for screening for depression: Secondary | ICD-10-CM | POA: Diagnosis not present

## 2019-10-26 DIAGNOSIS — R82998 Other abnormal findings in urine: Secondary | ICD-10-CM | POA: Diagnosis not present

## 2019-10-29 DIAGNOSIS — Z1212 Encounter for screening for malignant neoplasm of rectum: Secondary | ICD-10-CM | POA: Diagnosis not present

## 2019-10-30 ENCOUNTER — Ambulatory Visit: Payer: Medicare Other

## 2019-11-02 ENCOUNTER — Other Ambulatory Visit: Payer: Self-pay

## 2019-11-02 ENCOUNTER — Ambulatory Visit
Admission: RE | Admit: 2019-11-02 | Discharge: 2019-11-02 | Disposition: A | Discharge status: Home / Self Care | Payer: Medicare PPO | Source: Ambulatory Visit | Attending: Internal Medicine | Admitting: Internal Medicine

## 2019-11-02 DIAGNOSIS — Z1231 Encounter for screening mammogram for malignant neoplasm of breast: Secondary | ICD-10-CM | POA: Diagnosis not present

## 2020-01-15 DIAGNOSIS — H2511 Age-related nuclear cataract, right eye: Secondary | ICD-10-CM | POA: Diagnosis not present

## 2020-01-15 DIAGNOSIS — H524 Presbyopia: Secondary | ICD-10-CM | POA: Diagnosis not present

## 2020-03-04 DIAGNOSIS — Z85828 Personal history of other malignant neoplasm of skin: Secondary | ICD-10-CM | POA: Diagnosis not present

## 2020-03-04 DIAGNOSIS — H61032 Chondritis of left external ear: Secondary | ICD-10-CM | POA: Diagnosis not present

## 2020-03-04 DIAGNOSIS — L720 Epidermal cyst: Secondary | ICD-10-CM | POA: Diagnosis not present

## 2020-03-04 DIAGNOSIS — L820 Inflamed seborrheic keratosis: Secondary | ICD-10-CM | POA: Diagnosis not present

## 2020-03-04 DIAGNOSIS — H61002 Unspecified perichondritis of left external ear: Secondary | ICD-10-CM | POA: Diagnosis not present

## 2020-03-04 DIAGNOSIS — L218 Other seborrheic dermatitis: Secondary | ICD-10-CM | POA: Diagnosis not present

## 2020-03-04 DIAGNOSIS — D225 Melanocytic nevi of trunk: Secondary | ICD-10-CM | POA: Diagnosis not present

## 2020-03-04 DIAGNOSIS — L821 Other seborrheic keratosis: Secondary | ICD-10-CM | POA: Diagnosis not present

## 2020-04-25 DIAGNOSIS — I10 Essential (primary) hypertension: Secondary | ICD-10-CM | POA: Diagnosis not present

## 2020-04-25 DIAGNOSIS — M109 Gout, unspecified: Secondary | ICD-10-CM | POA: Diagnosis not present

## 2020-04-25 DIAGNOSIS — R946 Abnormal results of thyroid function studies: Secondary | ICD-10-CM | POA: Diagnosis not present

## 2020-04-25 DIAGNOSIS — C44310 Basal cell carcinoma of skin of unspecified parts of face: Secondary | ICD-10-CM | POA: Diagnosis not present

## 2020-04-25 DIAGNOSIS — M8580 Other specified disorders of bone density and structure, unspecified site: Secondary | ICD-10-CM | POA: Diagnosis not present

## 2020-04-25 DIAGNOSIS — M1990 Unspecified osteoarthritis, unspecified site: Secondary | ICD-10-CM | POA: Diagnosis not present

## 2020-04-25 DIAGNOSIS — E785 Hyperlipidemia, unspecified: Secondary | ICD-10-CM | POA: Diagnosis not present

## 2020-04-25 DIAGNOSIS — R739 Hyperglycemia, unspecified: Secondary | ICD-10-CM | POA: Diagnosis not present

## 2020-04-25 DIAGNOSIS — E669 Obesity, unspecified: Secondary | ICD-10-CM | POA: Diagnosis not present

## 2020-10-10 ENCOUNTER — Other Ambulatory Visit: Payer: Self-pay | Admitting: Internal Medicine

## 2020-10-10 DIAGNOSIS — Z1231 Encounter for screening mammogram for malignant neoplasm of breast: Secondary | ICD-10-CM

## 2020-10-24 DIAGNOSIS — R946 Abnormal results of thyroid function studies: Secondary | ICD-10-CM | POA: Diagnosis not present

## 2020-10-24 DIAGNOSIS — M859 Disorder of bone density and structure, unspecified: Secondary | ICD-10-CM | POA: Diagnosis not present

## 2020-10-24 DIAGNOSIS — M109 Gout, unspecified: Secondary | ICD-10-CM | POA: Diagnosis not present

## 2020-10-24 DIAGNOSIS — E785 Hyperlipidemia, unspecified: Secondary | ICD-10-CM | POA: Diagnosis not present

## 2020-10-28 DIAGNOSIS — E785 Hyperlipidemia, unspecified: Secondary | ICD-10-CM | POA: Diagnosis not present

## 2020-11-04 DIAGNOSIS — R739 Hyperglycemia, unspecified: Secondary | ICD-10-CM | POA: Diagnosis not present

## 2020-11-04 DIAGNOSIS — Z0000 Encounter for general adult medical examination without abnormal findings: Secondary | ICD-10-CM | POA: Diagnosis not present

## 2020-11-04 DIAGNOSIS — Z1389 Encounter for screening for other disorder: Secondary | ICD-10-CM | POA: Diagnosis not present

## 2020-11-04 DIAGNOSIS — E669 Obesity, unspecified: Secondary | ICD-10-CM | POA: Diagnosis not present

## 2020-11-04 DIAGNOSIS — Z1331 Encounter for screening for depression: Secondary | ICD-10-CM | POA: Diagnosis not present

## 2020-11-04 DIAGNOSIS — R82998 Other abnormal findings in urine: Secondary | ICD-10-CM | POA: Diagnosis not present

## 2020-11-04 DIAGNOSIS — E785 Hyperlipidemia, unspecified: Secondary | ICD-10-CM | POA: Diagnosis not present

## 2020-11-04 DIAGNOSIS — M8580 Other specified disorders of bone density and structure, unspecified site: Secondary | ICD-10-CM | POA: Diagnosis not present

## 2020-11-04 DIAGNOSIS — R946 Abnormal results of thyroid function studies: Secondary | ICD-10-CM | POA: Diagnosis not present

## 2020-11-04 DIAGNOSIS — E875 Hyperkalemia: Secondary | ICD-10-CM | POA: Diagnosis not present

## 2020-11-04 DIAGNOSIS — I10 Essential (primary) hypertension: Secondary | ICD-10-CM | POA: Diagnosis not present

## 2020-11-04 DIAGNOSIS — M109 Gout, unspecified: Secondary | ICD-10-CM | POA: Diagnosis not present

## 2020-11-11 ENCOUNTER — Ambulatory Visit
Admission: RE | Admit: 2020-11-11 | Discharge: 2020-11-11 | Disposition: A | Discharge status: Home / Self Care | Payer: Medicare PPO | Source: Ambulatory Visit | Attending: Internal Medicine | Admitting: Internal Medicine

## 2020-11-11 ENCOUNTER — Other Ambulatory Visit: Payer: Self-pay

## 2020-11-11 DIAGNOSIS — Z1231 Encounter for screening mammogram for malignant neoplasm of breast: Secondary | ICD-10-CM | POA: Diagnosis not present

## 2020-12-08 DIAGNOSIS — E785 Hyperlipidemia, unspecified: Secondary | ICD-10-CM | POA: Diagnosis not present

## 2021-01-30 DIAGNOSIS — H524 Presbyopia: Secondary | ICD-10-CM | POA: Diagnosis not present

## 2021-01-30 DIAGNOSIS — H2511 Age-related nuclear cataract, right eye: Secondary | ICD-10-CM | POA: Diagnosis not present

## 2021-02-19 DIAGNOSIS — Z23 Encounter for immunization: Secondary | ICD-10-CM | POA: Diagnosis not present

## 2021-03-13 DIAGNOSIS — L821 Other seborrheic keratosis: Secondary | ICD-10-CM | POA: Diagnosis not present

## 2021-03-13 DIAGNOSIS — L578 Other skin changes due to chronic exposure to nonionizing radiation: Secondary | ICD-10-CM | POA: Diagnosis not present

## 2021-03-13 DIAGNOSIS — D1801 Hemangioma of skin and subcutaneous tissue: Secondary | ICD-10-CM | POA: Diagnosis not present

## 2021-03-13 DIAGNOSIS — Z85828 Personal history of other malignant neoplasm of skin: Secondary | ICD-10-CM | POA: Diagnosis not present

## 2021-03-13 DIAGNOSIS — H61002 Unspecified perichondritis of left external ear: Secondary | ICD-10-CM | POA: Diagnosis not present

## 2021-05-15 DIAGNOSIS — E669 Obesity, unspecified: Secondary | ICD-10-CM | POA: Diagnosis not present

## 2021-05-15 DIAGNOSIS — R739 Hyperglycemia, unspecified: Secondary | ICD-10-CM | POA: Diagnosis not present

## 2021-05-15 DIAGNOSIS — I10 Essential (primary) hypertension: Secondary | ICD-10-CM | POA: Diagnosis not present

## 2021-05-15 DIAGNOSIS — R7401 Elevation of levels of liver transaminase levels: Secondary | ICD-10-CM | POA: Diagnosis not present

## 2021-05-15 DIAGNOSIS — R946 Abnormal results of thyroid function studies: Secondary | ICD-10-CM | POA: Diagnosis not present

## 2021-05-15 DIAGNOSIS — M8580 Other specified disorders of bone density and structure, unspecified site: Secondary | ICD-10-CM | POA: Diagnosis not present

## 2021-05-15 DIAGNOSIS — E875 Hyperkalemia: Secondary | ICD-10-CM | POA: Diagnosis not present

## 2021-05-15 DIAGNOSIS — E785 Hyperlipidemia, unspecified: Secondary | ICD-10-CM | POA: Diagnosis not present

## 2021-05-15 DIAGNOSIS — M109 Gout, unspecified: Secondary | ICD-10-CM | POA: Diagnosis not present

## 2021-10-20 DIAGNOSIS — M25561 Pain in right knee: Secondary | ICD-10-CM | POA: Diagnosis not present

## 2021-11-03 DIAGNOSIS — M25561 Pain in right knee: Secondary | ICD-10-CM | POA: Diagnosis not present

## 2021-11-05 ENCOUNTER — Other Ambulatory Visit: Payer: Self-pay | Admitting: Orthopedic Surgery

## 2021-11-05 DIAGNOSIS — M25561 Pain in right knee: Secondary | ICD-10-CM

## 2021-11-06 DIAGNOSIS — M109 Gout, unspecified: Secondary | ICD-10-CM | POA: Diagnosis not present

## 2021-11-06 DIAGNOSIS — E785 Hyperlipidemia, unspecified: Secondary | ICD-10-CM | POA: Diagnosis not present

## 2021-11-06 DIAGNOSIS — I10 Essential (primary) hypertension: Secondary | ICD-10-CM | POA: Diagnosis not present

## 2021-11-06 DIAGNOSIS — M8580 Other specified disorders of bone density and structure, unspecified site: Secondary | ICD-10-CM | POA: Diagnosis not present

## 2021-11-06 DIAGNOSIS — R739 Hyperglycemia, unspecified: Secondary | ICD-10-CM | POA: Diagnosis not present

## 2021-11-13 DIAGNOSIS — E875 Hyperkalemia: Secondary | ICD-10-CM | POA: Diagnosis not present

## 2021-11-13 DIAGNOSIS — Z1331 Encounter for screening for depression: Secondary | ICD-10-CM | POA: Diagnosis not present

## 2021-11-13 DIAGNOSIS — Z0000 Encounter for general adult medical examination without abnormal findings: Secondary | ICD-10-CM | POA: Diagnosis not present

## 2021-11-13 DIAGNOSIS — E669 Obesity, unspecified: Secondary | ICD-10-CM | POA: Diagnosis not present

## 2021-11-13 DIAGNOSIS — E785 Hyperlipidemia, unspecified: Secondary | ICD-10-CM | POA: Diagnosis not present

## 2021-11-13 DIAGNOSIS — Z1389 Encounter for screening for other disorder: Secondary | ICD-10-CM | POA: Diagnosis not present

## 2021-11-13 DIAGNOSIS — M25561 Pain in right knee: Secondary | ICD-10-CM | POA: Diagnosis not present

## 2021-11-13 DIAGNOSIS — R739 Hyperglycemia, unspecified: Secondary | ICD-10-CM | POA: Diagnosis not present

## 2021-11-13 DIAGNOSIS — R82998 Other abnormal findings in urine: Secondary | ICD-10-CM | POA: Diagnosis not present

## 2021-11-13 DIAGNOSIS — I10 Essential (primary) hypertension: Secondary | ICD-10-CM | POA: Diagnosis not present

## 2021-11-13 DIAGNOSIS — R946 Abnormal results of thyroid function studies: Secondary | ICD-10-CM | POA: Diagnosis not present

## 2021-11-13 DIAGNOSIS — M8580 Other specified disorders of bone density and structure, unspecified site: Secondary | ICD-10-CM | POA: Diagnosis not present

## 2021-11-25 ENCOUNTER — Ambulatory Visit
Admission: RE | Admit: 2021-11-25 | Discharge: 2021-11-25 | Disposition: A | Discharge status: Home / Self Care | Payer: Medicare PPO | Source: Ambulatory Visit | Attending: Orthopedic Surgery | Admitting: Orthopedic Surgery

## 2021-11-25 DIAGNOSIS — M25561 Pain in right knee: Secondary | ICD-10-CM | POA: Diagnosis not present

## 2021-11-30 DIAGNOSIS — S83281A Other tear of lateral meniscus, current injury, right knee, initial encounter: Secondary | ICD-10-CM | POA: Diagnosis not present

## 2021-12-11 DIAGNOSIS — I10 Essential (primary) hypertension: Secondary | ICD-10-CM | POA: Diagnosis not present

## 2021-12-11 DIAGNOSIS — E785 Hyperlipidemia, unspecified: Secondary | ICD-10-CM | POA: Diagnosis not present

## 2021-12-28 DIAGNOSIS — M94261 Chondromalacia, right knee: Secondary | ICD-10-CM | POA: Diagnosis not present

## 2021-12-28 DIAGNOSIS — M6751 Plica syndrome, right knee: Secondary | ICD-10-CM | POA: Diagnosis not present

## 2021-12-28 DIAGNOSIS — G8918 Other acute postprocedural pain: Secondary | ICD-10-CM | POA: Diagnosis not present

## 2021-12-28 DIAGNOSIS — S83271A Complex tear of lateral meniscus, current injury, right knee, initial encounter: Secondary | ICD-10-CM | POA: Diagnosis not present

## 2021-12-28 DIAGNOSIS — S83231A Complex tear of medial meniscus, current injury, right knee, initial encounter: Secondary | ICD-10-CM | POA: Diagnosis not present

## 2022-01-05 DIAGNOSIS — M25561 Pain in right knee: Secondary | ICD-10-CM | POA: Diagnosis not present

## 2022-01-05 DIAGNOSIS — S8391XD Sprain of unspecified site of right knee, subsequent encounter: Secondary | ICD-10-CM | POA: Diagnosis not present

## 2022-01-12 DIAGNOSIS — S8391XD Sprain of unspecified site of right knee, subsequent encounter: Secondary | ICD-10-CM | POA: Diagnosis not present

## 2022-01-12 DIAGNOSIS — M25561 Pain in right knee: Secondary | ICD-10-CM | POA: Diagnosis not present

## 2022-01-15 DIAGNOSIS — S8391XD Sprain of unspecified site of right knee, subsequent encounter: Secondary | ICD-10-CM | POA: Diagnosis not present

## 2022-01-15 DIAGNOSIS — M25561 Pain in right knee: Secondary | ICD-10-CM | POA: Diagnosis not present

## 2022-01-19 DIAGNOSIS — S8391XD Sprain of unspecified site of right knee, subsequent encounter: Secondary | ICD-10-CM | POA: Diagnosis not present

## 2022-01-19 DIAGNOSIS — M25561 Pain in right knee: Secondary | ICD-10-CM | POA: Diagnosis not present

## 2022-01-22 DIAGNOSIS — M25561 Pain in right knee: Secondary | ICD-10-CM | POA: Diagnosis not present

## 2022-01-22 DIAGNOSIS — S8391XD Sprain of unspecified site of right knee, subsequent encounter: Secondary | ICD-10-CM | POA: Diagnosis not present

## 2022-01-26 DIAGNOSIS — M25561 Pain in right knee: Secondary | ICD-10-CM | POA: Diagnosis not present

## 2022-01-26 DIAGNOSIS — S8391XD Sprain of unspecified site of right knee, subsequent encounter: Secondary | ICD-10-CM | POA: Diagnosis not present

## 2022-02-01 ENCOUNTER — Other Ambulatory Visit: Payer: Self-pay | Admitting: Internal Medicine

## 2022-02-01 DIAGNOSIS — H52203 Unspecified astigmatism, bilateral: Secondary | ICD-10-CM | POA: Diagnosis not present

## 2022-02-01 DIAGNOSIS — Z1231 Encounter for screening mammogram for malignant neoplasm of breast: Secondary | ICD-10-CM

## 2022-02-01 DIAGNOSIS — H2511 Age-related nuclear cataract, right eye: Secondary | ICD-10-CM | POA: Diagnosis not present

## 2022-02-02 ENCOUNTER — Ambulatory Visit
Admission: RE | Admit: 2022-02-02 | Discharge: 2022-02-02 | Disposition: A | Discharge status: Home / Self Care | Payer: Medicare PPO | Source: Ambulatory Visit | Attending: Internal Medicine | Admitting: Internal Medicine

## 2022-02-02 DIAGNOSIS — Z1231 Encounter for screening mammogram for malignant neoplasm of breast: Secondary | ICD-10-CM

## 2022-04-27 DIAGNOSIS — L218 Other seborrheic dermatitis: Secondary | ICD-10-CM | POA: Diagnosis not present

## 2022-04-27 DIAGNOSIS — D485 Neoplasm of uncertain behavior of skin: Secondary | ICD-10-CM | POA: Diagnosis not present

## 2022-04-27 DIAGNOSIS — L720 Epidermal cyst: Secondary | ICD-10-CM | POA: Diagnosis not present

## 2022-04-27 DIAGNOSIS — Z85828 Personal history of other malignant neoplasm of skin: Secondary | ICD-10-CM | POA: Diagnosis not present

## 2022-04-27 DIAGNOSIS — L821 Other seborrheic keratosis: Secondary | ICD-10-CM | POA: Diagnosis not present

## 2022-05-21 DIAGNOSIS — M1990 Unspecified osteoarthritis, unspecified site: Secondary | ICD-10-CM | POA: Diagnosis not present

## 2022-05-21 DIAGNOSIS — E785 Hyperlipidemia, unspecified: Secondary | ICD-10-CM | POA: Diagnosis not present

## 2022-05-21 DIAGNOSIS — R739 Hyperglycemia, unspecified: Secondary | ICD-10-CM | POA: Diagnosis not present

## 2022-05-21 DIAGNOSIS — E669 Obesity, unspecified: Secondary | ICD-10-CM | POA: Diagnosis not present

## 2022-05-21 DIAGNOSIS — R946 Abnormal results of thyroid function studies: Secondary | ICD-10-CM | POA: Diagnosis not present

## 2022-05-21 DIAGNOSIS — M109 Gout, unspecified: Secondary | ICD-10-CM | POA: Diagnosis not present

## 2022-05-21 DIAGNOSIS — I10 Essential (primary) hypertension: Secondary | ICD-10-CM | POA: Diagnosis not present

## 2022-05-21 DIAGNOSIS — M8580 Other specified disorders of bone density and structure, unspecified site: Secondary | ICD-10-CM | POA: Diagnosis not present

## 2022-05-21 DIAGNOSIS — E875 Hyperkalemia: Secondary | ICD-10-CM | POA: Diagnosis not present

## 2022-07-23 DIAGNOSIS — E785 Hyperlipidemia, unspecified: Secondary | ICD-10-CM | POA: Diagnosis not present

## 2022-11-09 DIAGNOSIS — M109 Gout, unspecified: Secondary | ICD-10-CM | POA: Diagnosis not present

## 2022-11-09 DIAGNOSIS — R739 Hyperglycemia, unspecified: Secondary | ICD-10-CM | POA: Diagnosis not present

## 2022-11-09 DIAGNOSIS — R946 Abnormal results of thyroid function studies: Secondary | ICD-10-CM | POA: Diagnosis not present

## 2022-11-09 DIAGNOSIS — I10 Essential (primary) hypertension: Secondary | ICD-10-CM | POA: Diagnosis not present

## 2022-11-09 DIAGNOSIS — E785 Hyperlipidemia, unspecified: Secondary | ICD-10-CM | POA: Diagnosis not present

## 2022-11-09 DIAGNOSIS — M8580 Other specified disorders of bone density and structure, unspecified site: Secondary | ICD-10-CM | POA: Diagnosis not present

## 2022-11-12 IMAGING — MR MR KNEE*R* W/O CM
6 series · 40 of 40 positions shown · non-contrast
Comparison: None Available.

CLINICAL DATA: Right knee pain.

EXAM:
MRI OF THE RIGHT KNEE WITHOUT CONTRAST
TECHNIQUE: Multiplanar, multisequence MR imaging of the knee was performed. No
intravenous contrast was administered.

[Series 6: T2 fat-sat · axial · right · 4.0mm · 0.50mm/px · z∈[-83,+29]mm · 5 of 27 slices shown (1 of 3)]
[im 1/27]
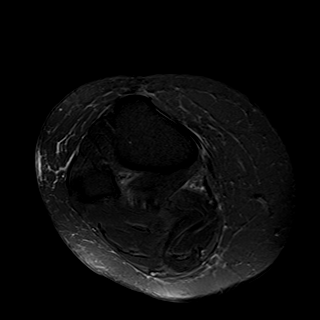
[im 7/27]
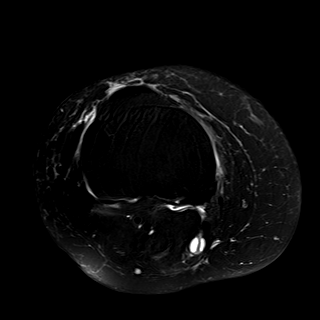
[im 14/27]
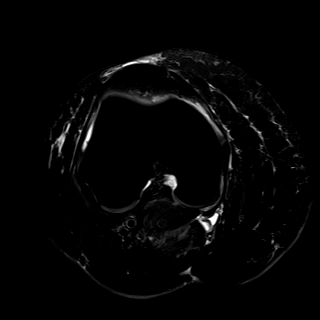
[im 20/27]
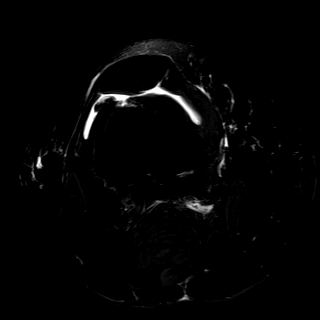
[im 27/27]
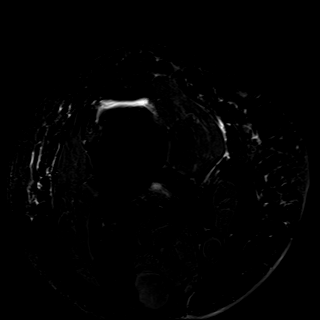

[Series 7: T2 fat-sat · coronal · right · 4.0mm · 0.47mm/px · 7 of 28 slices shown (2 of 3)]
[im 1/28]
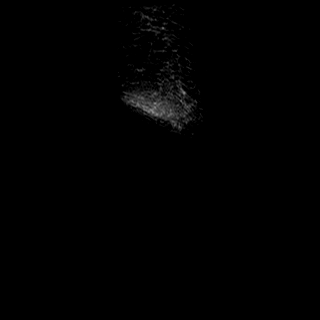
[im 5/28]
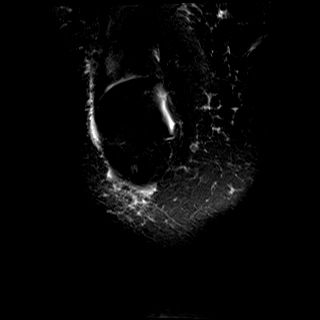
[im 10/28]
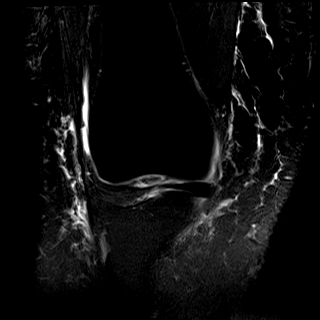
[im 14/28]
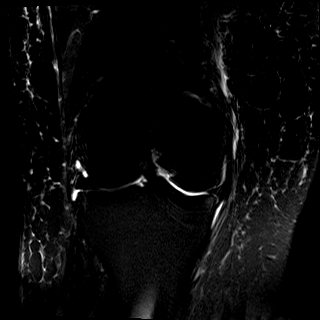
[im 19/28]
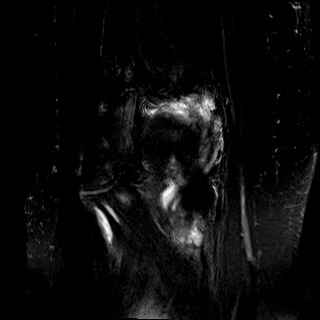
[im 23/28]
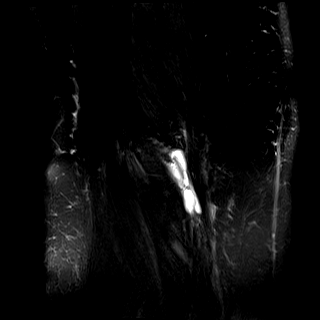
[im 28/28]
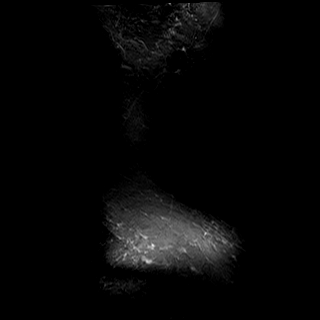

[Series 8: T1 · coronal · right · 4.0mm · 0.47mm/px · 7 of 28 slices shown]
[im 1/28]
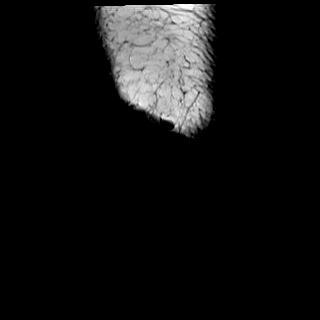
[im 5/28]
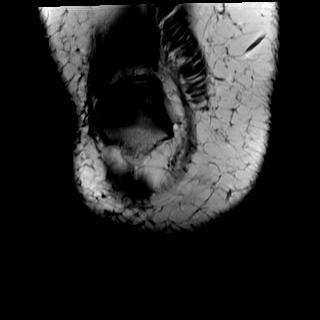
[im 10/28]
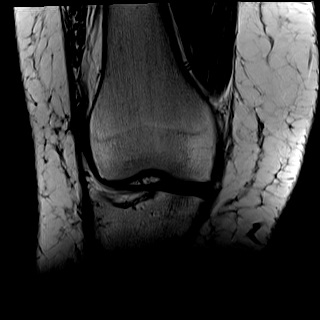
[im 14/28]
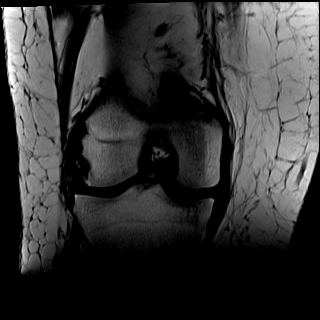
[im 19/28]
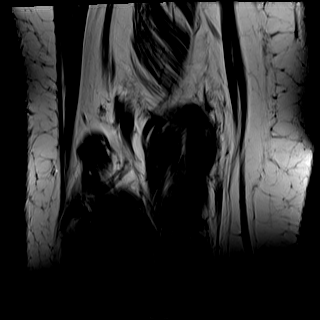
[im 23/28]
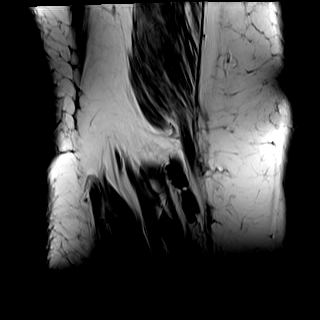
[im 28/28]
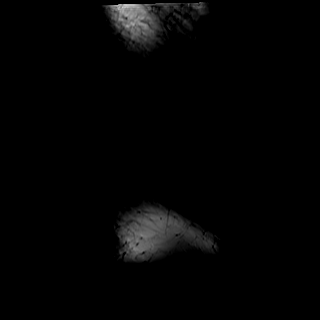

[Series 9: PD fat-sat · coronal · right · 3.0mm · 0.47mm/px · 7 of 28 slices shown (1 of 2)]
[im 1/28]
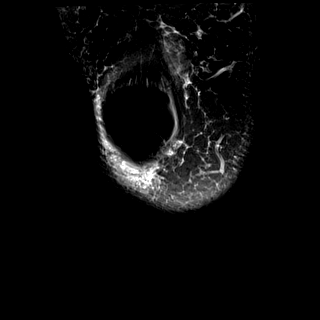
[im 5/28]
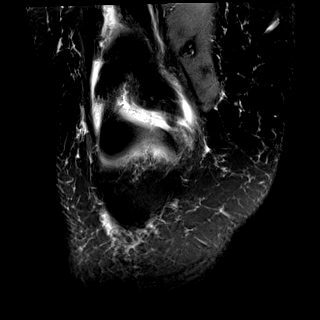
[im 10/28]
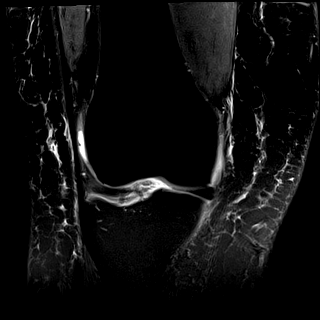
[im 14/28]
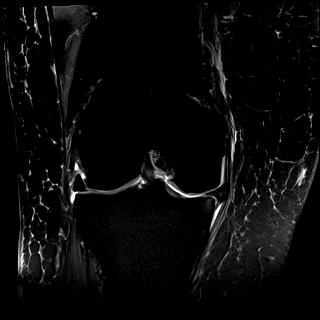
[im 19/28]
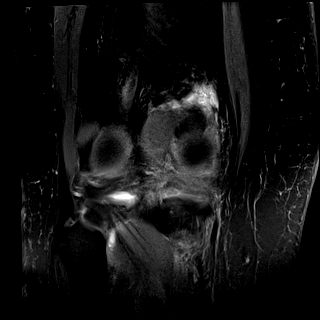
[im 23/28]
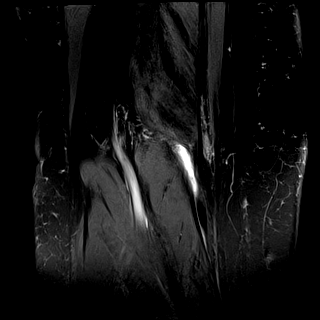
[im 28/28]
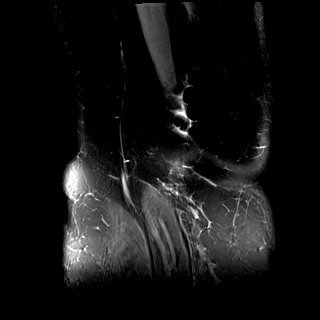

[Series 10: PD fat-sat · sagittal · right · 3.0mm · 0.47mm/px · 7 of 28 slices shown (2 of 2)]
[im 1/28]
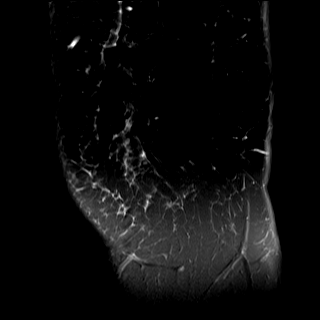
[im 5/28]
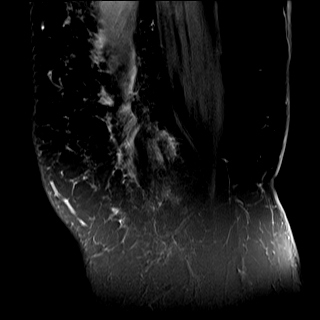
[im 10/28]
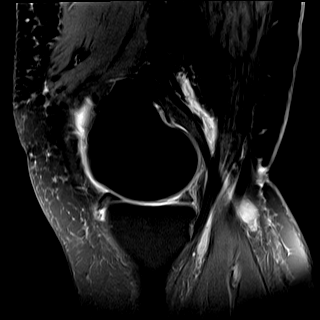
[im 14/28]
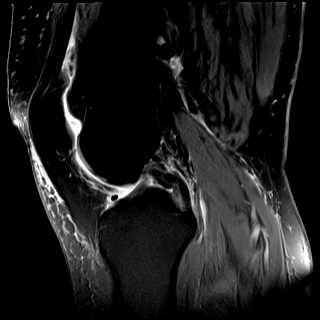
[im 19/28]
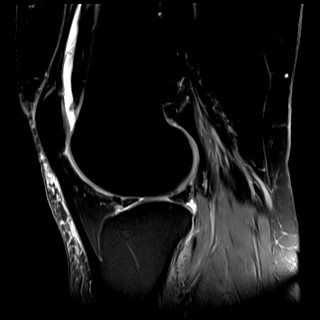
[im 23/28]
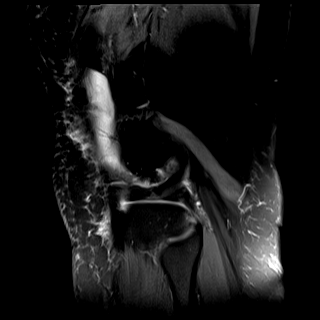
[im 28/28]
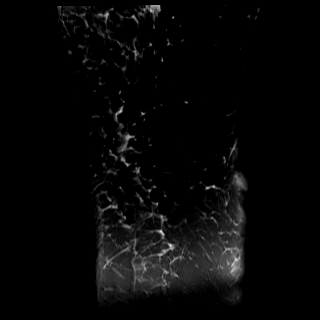

[Series 11: T2 fat-sat · sagittal · right · 3.0mm · 0.47mm/px · 7 of 28 slices shown (3 of 3)]
[im 1/28]
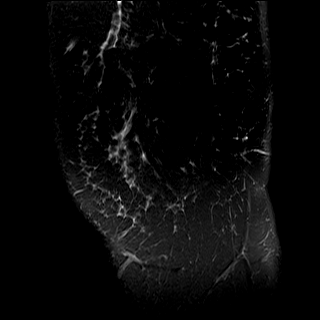
[im 5/28]
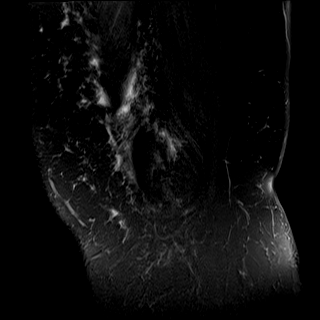
[im 10/28]
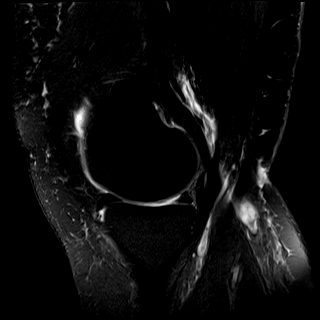
[im 14/28]
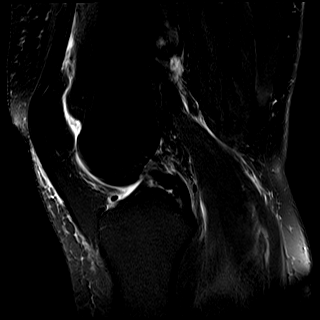
[im 19/28]
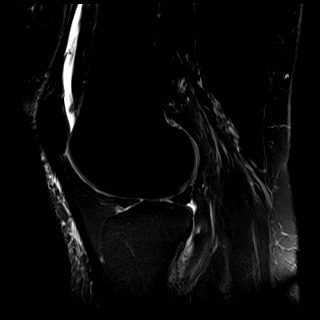
[im 23/28]
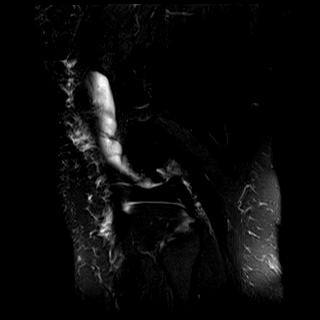
[im 28/28]
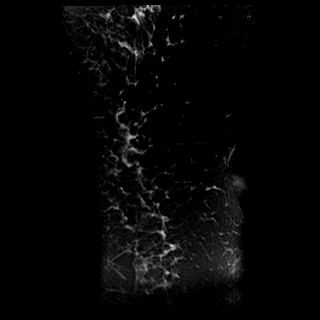

[40 of 40 positions shown; findings below may reference images not displayed]

FINDINGS: MENISCI

Medial meniscus: There is a vertically oriented tear within the
peripheral third of the meniscal triangle of the root of the
posterior horn of the medial meniscus which is mildly complex and
measures up to 2.5 mm in AP dimension (sagittal series 10, image
12), extending through the junction of the superior articular
surface on the posterior wall and the inferior articular surface.
Within the more medial aspect of the posterior horn there is an
oblique tear extending through the inferior articular surface of the
central third of the meniscal triangle (sagittal image 9). There is
moderate extrusion of the posterior greater than anterior aspects of
the body of the medial meniscus. There is intermediate proton
density signal intrasubstance degeneration within the inferior
greater than anterior aspects of the anterior horn of the medial
meniscus (sagittal image 10).

Lateral meniscus: There is minimal degenerative fraying of the
superior aspect of the central third of the meniscal triangle of the
mid AP dimension of the body of the lateral meniscus (coronal series
9, images 14 and 15). There is intrasubstance degeneration within
the anterior horn of the lateral meniscus contacting the superior
articular surface of the peripheral third of the meniscal triangle
(sagittal image 21). There is a thin vertically oriented tear within
the peripheral third of the posterior horn of the lateral meniscus
contacting the superior articular surface (sagittal series 10 images
19 through 21). Moderate extrusion of the body of the lateral
meniscus.

LIGAMENTS

Cruciates: The ACL and PCL are intact.

Collaterals: The medial collateral ligament is intact. The fibular
collateral ligament, biceps femoris tendon, iliotibial band, and
popliteus tendon are intact.

CARTILAGE

Patellofemoral: Full-thickness cartilage loss within the superior
50% of the patellar apex (axial series 6 images 7 through 9,
sagittal image 15). Full-thickness cartilage loss within the
inferior aspect of the medial trochlear cartilage (sagittal image
13).

Medial: Mild thinning of the weight-bearing medial femoral condyle
cartilage, greatest posteriorly.

Lateral: High-grade partial to full-thickness cartilage loss within
the posteromedial aspect of the weight-bearing lateral femoral
condyle (coronal image 13).

Joint: Mildjoint effusion. Normal Hoffa's fat pad. No plical
thickening.

Popliteal Fossa: Mild-to-moderate Baker's cyst with multiple
internal septations/debris, measuring up to 6 mm in transverse
dimension 22 mm in greatest AP dimension, and extending up to 9 cm
in craniocaudal dimension.

Extensor Mechanism:  Intact quadriceps tendon and patellar tendon.

Bones:  No acute fracture or dislocation.

Other: None.
IMPRESSION: 1. Multiple medial and multiple lateral meniscal tears, as above.
2. Mild-to-moderate patellofemoral and lateral femoral condyle
cartilage degenerative changes.
3. Mild joint effusion.
4. Mild-to-moderate Baker's cyst.

## 2022-11-22 DIAGNOSIS — R946 Abnormal results of thyroid function studies: Secondary | ICD-10-CM | POA: Diagnosis not present

## 2022-11-22 DIAGNOSIS — Z23 Encounter for immunization: Secondary | ICD-10-CM | POA: Diagnosis not present

## 2022-11-22 DIAGNOSIS — M109 Gout, unspecified: Secondary | ICD-10-CM | POA: Diagnosis not present

## 2022-11-22 DIAGNOSIS — M8580 Other specified disorders of bone density and structure, unspecified site: Secondary | ICD-10-CM | POA: Diagnosis not present

## 2022-11-22 DIAGNOSIS — R739 Hyperglycemia, unspecified: Secondary | ICD-10-CM | POA: Diagnosis not present

## 2022-11-22 DIAGNOSIS — E875 Hyperkalemia: Secondary | ICD-10-CM | POA: Diagnosis not present

## 2022-11-22 DIAGNOSIS — E785 Hyperlipidemia, unspecified: Secondary | ICD-10-CM | POA: Diagnosis not present

## 2022-11-22 DIAGNOSIS — I10 Essential (primary) hypertension: Secondary | ICD-10-CM | POA: Diagnosis not present

## 2022-11-22 DIAGNOSIS — Z0000 Encounter for general adult medical examination without abnormal findings: Secondary | ICD-10-CM | POA: Diagnosis not present

## 2022-11-22 DIAGNOSIS — R82998 Other abnormal findings in urine: Secondary | ICD-10-CM | POA: Diagnosis not present

## 2022-12-21 ENCOUNTER — Other Ambulatory Visit: Payer: Self-pay | Admitting: Internal Medicine

## 2022-12-21 DIAGNOSIS — Z1231 Encounter for screening mammogram for malignant neoplasm of breast: Secondary | ICD-10-CM

## 2023-02-04 ENCOUNTER — Ambulatory Visit
Admission: RE | Admit: 2023-02-04 | Discharge: 2023-02-04 | Disposition: A | Discharge status: Home / Self Care | Payer: Medicare HMO | Source: Ambulatory Visit | Attending: Internal Medicine | Admitting: Internal Medicine

## 2023-02-04 DIAGNOSIS — Z1231 Encounter for screening mammogram for malignant neoplasm of breast: Secondary | ICD-10-CM | POA: Diagnosis not present

## 2023-02-07 DIAGNOSIS — H524 Presbyopia: Secondary | ICD-10-CM | POA: Diagnosis not present

## 2023-02-07 DIAGNOSIS — H2511 Age-related nuclear cataract, right eye: Secondary | ICD-10-CM | POA: Diagnosis not present

## 2023-03-02 DIAGNOSIS — H25811 Combined forms of age-related cataract, right eye: Secondary | ICD-10-CM | POA: Diagnosis not present

## 2023-03-02 DIAGNOSIS — H268 Other specified cataract: Secondary | ICD-10-CM | POA: Diagnosis not present

## 2023-03-02 DIAGNOSIS — H2511 Age-related nuclear cataract, right eye: Secondary | ICD-10-CM | POA: Diagnosis not present

## 2023-03-11 DIAGNOSIS — Z23 Encounter for immunization: Secondary | ICD-10-CM | POA: Diagnosis not present

## 2023-05-20 DIAGNOSIS — E669 Obesity, unspecified: Secondary | ICD-10-CM | POA: Diagnosis not present

## 2023-05-20 DIAGNOSIS — M8580 Other specified disorders of bone density and structure, unspecified site: Secondary | ICD-10-CM | POA: Diagnosis not present

## 2023-05-20 DIAGNOSIS — M109 Gout, unspecified: Secondary | ICD-10-CM | POA: Diagnosis not present

## 2023-05-20 DIAGNOSIS — R739 Hyperglycemia, unspecified: Secondary | ICD-10-CM | POA: Diagnosis not present

## 2023-05-20 DIAGNOSIS — E875 Hyperkalemia: Secondary | ICD-10-CM | POA: Diagnosis not present

## 2023-05-20 DIAGNOSIS — I10 Essential (primary) hypertension: Secondary | ICD-10-CM | POA: Diagnosis not present

## 2023-05-20 DIAGNOSIS — E785 Hyperlipidemia, unspecified: Secondary | ICD-10-CM | POA: Diagnosis not present

## 2023-05-20 DIAGNOSIS — R946 Abnormal results of thyroid function studies: Secondary | ICD-10-CM | POA: Diagnosis not present

## 2023-05-20 DIAGNOSIS — C44310 Basal cell carcinoma of skin of unspecified parts of face: Secondary | ICD-10-CM | POA: Diagnosis not present

## 2023-10-04 DIAGNOSIS — H3562 Retinal hemorrhage, left eye: Secondary | ICD-10-CM | POA: Diagnosis not present

## 2023-10-04 DIAGNOSIS — H04123 Dry eye syndrome of bilateral lacrimal glands: Secondary | ICD-10-CM | POA: Diagnosis not present

## 2023-11-18 DIAGNOSIS — R739 Hyperglycemia, unspecified: Secondary | ICD-10-CM | POA: Diagnosis not present

## 2023-11-18 DIAGNOSIS — I10 Essential (primary) hypertension: Secondary | ICD-10-CM | POA: Diagnosis not present

## 2023-11-18 DIAGNOSIS — M109 Gout, unspecified: Secondary | ICD-10-CM | POA: Diagnosis not present

## 2023-11-18 DIAGNOSIS — Z1212 Encounter for screening for malignant neoplasm of rectum: Secondary | ICD-10-CM | POA: Diagnosis not present

## 2023-11-18 DIAGNOSIS — R946 Abnormal results of thyroid function studies: Secondary | ICD-10-CM | POA: Diagnosis not present

## 2023-11-18 DIAGNOSIS — E785 Hyperlipidemia, unspecified: Secondary | ICD-10-CM | POA: Diagnosis not present

## 2023-11-18 DIAGNOSIS — M8580 Other specified disorders of bone density and structure, unspecified site: Secondary | ICD-10-CM | POA: Diagnosis not present

## 2023-11-25 DIAGNOSIS — R82998 Other abnormal findings in urine: Secondary | ICD-10-CM | POA: Diagnosis not present

## 2023-11-25 DIAGNOSIS — E875 Hyperkalemia: Secondary | ICD-10-CM | POA: Diagnosis not present

## 2023-11-25 DIAGNOSIS — R739 Hyperglycemia, unspecified: Secondary | ICD-10-CM | POA: Diagnosis not present

## 2023-11-25 DIAGNOSIS — Z0000 Encounter for general adult medical examination without abnormal findings: Secondary | ICD-10-CM | POA: Diagnosis not present

## 2023-11-25 DIAGNOSIS — C44310 Basal cell carcinoma of skin of unspecified parts of face: Secondary | ICD-10-CM | POA: Diagnosis not present

## 2023-11-25 DIAGNOSIS — E669 Obesity, unspecified: Secondary | ICD-10-CM | POA: Diagnosis not present

## 2023-11-25 DIAGNOSIS — M109 Gout, unspecified: Secondary | ICD-10-CM | POA: Diagnosis not present

## 2023-11-25 DIAGNOSIS — I10 Essential (primary) hypertension: Secondary | ICD-10-CM | POA: Diagnosis not present

## 2023-11-25 DIAGNOSIS — R413 Other amnesia: Secondary | ICD-10-CM | POA: Diagnosis not present

## 2023-11-25 DIAGNOSIS — E785 Hyperlipidemia, unspecified: Secondary | ICD-10-CM | POA: Diagnosis not present

## 2024-01-03 DIAGNOSIS — H3562 Retinal hemorrhage, left eye: Secondary | ICD-10-CM | POA: Diagnosis not present
